# Patient Record
Sex: Female | Born: 1935
Health system: Southern US, Community
[De-identification: ages and names within clinical notes are randomized; demographics above are authoritative.]

## PROBLEM LIST (undated history)

## (undated) DIAGNOSIS — E079 Disorder of thyroid, unspecified: Secondary | ICD-10-CM

## (undated) HISTORY — DX: Disorder of thyroid, unspecified: E07.9

## (undated) HISTORY — PX: TUBAL LIGATION: SHX77

## (undated) HISTORY — PX: CATARACT EXTRACTION BILATERAL W/ ANTERIOR VITRECTOMY: SHX1304

## (undated) HISTORY — PX: APPENDECTOMY: SHX54

---

## 1999-03-24 ENCOUNTER — Other Ambulatory Visit: Admission: RE | Admit: 1999-03-24 | Discharge: 1999-03-24 | Payer: Self-pay | Admitting: Cardiology

## 2000-11-24 ENCOUNTER — Other Ambulatory Visit: Admission: RE | Admit: 2000-11-24 | Discharge: 2000-11-24 | Payer: Self-pay | Admitting: Internal Medicine

## 2001-03-11 ENCOUNTER — Encounter: Payer: Self-pay | Admitting: Internal Medicine

## 2001-03-11 ENCOUNTER — Encounter: Admission: RE | Admit: 2001-03-11 | Discharge: 2001-03-11 | Payer: Self-pay | Admitting: Internal Medicine

## 2001-11-29 ENCOUNTER — Other Ambulatory Visit: Admission: RE | Admit: 2001-11-29 | Discharge: 2001-11-29 | Payer: Self-pay | Admitting: Internal Medicine

## 2002-04-06 ENCOUNTER — Encounter: Admission: RE | Admit: 2002-04-06 | Discharge: 2002-04-06 | Payer: Self-pay | Admitting: Internal Medicine

## 2002-04-06 ENCOUNTER — Encounter: Payer: Self-pay | Admitting: Internal Medicine

## 2002-07-07 ENCOUNTER — Encounter: Admission: RE | Admit: 2002-07-07 | Discharge: 2002-07-07 | Payer: Self-pay | Admitting: Internal Medicine

## 2002-07-07 ENCOUNTER — Encounter: Payer: Self-pay | Admitting: Internal Medicine

## 2002-10-23 ENCOUNTER — Ambulatory Visit (HOSPITAL_COMMUNITY): Admission: RE | Admit: 2002-10-23 | Discharge: 2002-10-23 | Payer: Self-pay | Admitting: *Deleted

## 2002-10-23 ENCOUNTER — Encounter (INDEPENDENT_AMBULATORY_CARE_PROVIDER_SITE_OTHER): Payer: Self-pay

## 2003-02-05 ENCOUNTER — Other Ambulatory Visit: Admission: RE | Admit: 2003-02-05 | Discharge: 2003-02-05 | Payer: Self-pay | Admitting: Internal Medicine

## 2003-07-30 ENCOUNTER — Encounter: Admission: RE | Admit: 2003-07-30 | Discharge: 2003-07-30 | Payer: Self-pay | Admitting: Internal Medicine

## 2003-07-30 ENCOUNTER — Encounter: Payer: Self-pay | Admitting: Internal Medicine

## 2003-08-03 ENCOUNTER — Encounter: Admission: RE | Admit: 2003-08-03 | Discharge: 2003-08-03 | Payer: Self-pay | Admitting: Internal Medicine

## 2003-08-03 ENCOUNTER — Encounter: Payer: Self-pay | Admitting: Internal Medicine

## 2004-08-19 ENCOUNTER — Encounter: Admission: RE | Admit: 2004-08-19 | Discharge: 2004-08-19 | Payer: Self-pay | Admitting: Internal Medicine

## 2005-07-23 ENCOUNTER — Other Ambulatory Visit: Admission: RE | Admit: 2005-07-23 | Discharge: 2005-07-23 | Payer: Self-pay | Admitting: Internal Medicine

## 2005-08-31 ENCOUNTER — Encounter: Admission: RE | Admit: 2005-08-31 | Discharge: 2005-08-31 | Payer: Self-pay | Admitting: Internal Medicine

## 2006-09-02 ENCOUNTER — Encounter: Admission: RE | Admit: 2006-09-02 | Discharge: 2006-09-02 | Payer: Self-pay | Admitting: Internal Medicine

## 2007-09-07 ENCOUNTER — Encounter: Admission: RE | Admit: 2007-09-07 | Discharge: 2007-09-07 | Payer: Self-pay | Admitting: Obstetrics and Gynecology

## 2008-09-26 ENCOUNTER — Encounter: Admission: RE | Admit: 2008-09-26 | Discharge: 2008-09-26 | Payer: Self-pay | Admitting: Obstetrics and Gynecology

## 2009-08-05 ENCOUNTER — Encounter: Admission: RE | Admit: 2009-08-05 | Discharge: 2009-08-05 | Payer: Self-pay | Admitting: Internal Medicine

## 2009-10-02 ENCOUNTER — Encounter: Admission: RE | Admit: 2009-10-02 | Discharge: 2009-10-02 | Payer: Self-pay | Admitting: Obstetrics and Gynecology

## 2010-06-10 ENCOUNTER — Emergency Department (HOSPITAL_COMMUNITY): Admission: EM | Admit: 2010-06-10 | Discharge: 2010-06-10 | Payer: Self-pay | Admitting: Emergency Medicine

## 2011-04-03 NOTE — Op Note (Signed)
   NAME:  Evelyn Hurst, Evelyn Hurst                   ACCOUNT NO.:  [id number]   MEDICAL RECORD NO.:  [id number]                   PATIENT TYPE:  AMB   LOCATION:  ENDO                                 FACILITY:  [facility]   PHYSICIAN:  Georgiana Spinner, M.D.                 DATE OF BIRTH:  02-13-1935   DATE OF PROCEDURE:  DATE OF DISCHARGE:                                 OPERATIVE REPORT   PROCEDURE:  Upper endoscopy.   INDICATIONS:  GERD.   ANESTHESIA:  Demerol 50, Versed 6 mg.   DESCRIPTION OF PROCEDURE:  With the patient mildly sedated in the left  lateral decubitus position, the Olympus videoscopic endoscope was inserted  into the mouth, passed under direct vision through the esophagus which  appeared normal.  We entered into the stomach.  The fundus, body, antrum,  duodenal bulb, and second portion of the duodenum all appeared normal and  were photographed.  From this point, the endoscope was slowly withdrawn  taking circumferential views of the duodenal mucosa until the endoscope was  then pulled back into the stomach, placed in retroflexion to view the  stomach from below, and a slightly loose GE junction was seen and  photographed.  The endoscope was straightened and withdrawn taking  circumferential views of the remaining gastric and esophageal mucosa,  stopping in the distal esophagus to photograph the GE junction.  There was a  questionable area of Barrett's esophagus photographed and biopsied.  The  endoscope was withdrawn.  The patient's vital signs and pulse oximeter  remained stable.  The patient tolerated the procedure well with apparent  complications.   FINDINGS:  Questionable area of Barrett's esophagus, otherwise unremarkable  examination.  Await biopsy report.  The patient will call me for results and  follow up with me as an outpatient.  Proceed to colonoscopy as planned.                                               Georgiana Spinner, M.D.    GMO/MEDQ  D:  10/23/2002   T:  10/23/2002  Job:  [id number]   cc:   Darius Bump, M.D.  [address] [place]  [place] [address]  Fax: [phone]

## 2011-04-03 NOTE — Op Note (Signed)
   NAME:  Evelyn Hurst, Evelyn Hurst                   ACCOUNT NO.:  [id number]   MEDICAL RECORD NO.:  [id number]                   PATIENT TYPE:  AMB   LOCATION:  ENDO                                 FACILITY:  [facility]   PHYSICIAN:  Georgiana Spinner, M.D.                 DATE OF BIRTH:  1935/11/10   DATE OF PROCEDURE:  10/23/2002  DATE OF DISCHARGE:                                 OPERATIVE REPORT   PROCEDURE:  Colonoscopy.   INDICATIONS:  Colon cancer screening.   ANESTHESIA:  Versed 2 mg and Demerol 2 mg additionally.   DESCRIPTION OF PROCEDURE:  With the patient mildly sedated in the left  lateral decubitus position, the Olympus videoscopic colonoscope was inserted  in the rectum and passed easily under direct vision into the cecum,  identified by the ileocecal valve and appendiceal orifice, both of which  were photographed.  From this point the colonoscope was slowly withdrawn,  taking circumferential views of the entire colonic mucosa, stopping only in  the rectum, which appeared normal on direct and retroflex view.  The  endoscope was straightened and withdrawn.  The patient's vital signs and  pulse oximetry remained stable.  The patient tolerated the procedure well  without apparent complications.   FINDINGS:  This is a negative colonoscopic examination.   PLAN:  Repeat examination in possibly five or 10 years.                                               Georgiana Spinner, M.D.    GMO/MEDQ  D:  10/23/2002  T:  10/23/2002  Job:  [id number]   cc:   Darius Bump, M.D.  [place]  [place] [address]  Fax: [phone]

## 2012-05-04 DIAGNOSIS — L309 Dermatitis, unspecified: Secondary | ICD-10-CM | POA: Insufficient documentation

## 2012-05-10 DIAGNOSIS — L239 Allergic contact dermatitis, unspecified cause: Secondary | ICD-10-CM | POA: Insufficient documentation

## 2012-05-11 ENCOUNTER — Other Ambulatory Visit: Payer: Self-pay | Admitting: Obstetrics and Gynecology

## 2012-05-11 DIAGNOSIS — Z1231 Encounter for screening mammogram for malignant neoplasm of breast: Secondary | ICD-10-CM

## 2012-05-16 ENCOUNTER — Ambulatory Visit
Admission: RE | Admit: 2012-05-16 | Discharge: 2012-05-16 | Disposition: A | Discharge status: Home / Self Care | Payer: Medicare Other | Source: Ambulatory Visit | Attending: Obstetrics and Gynecology | Admitting: Obstetrics and Gynecology

## 2012-05-16 DIAGNOSIS — Z1231 Encounter for screening mammogram for malignant neoplasm of breast: Secondary | ICD-10-CM

## 2013-06-30 ENCOUNTER — Other Ambulatory Visit: Payer: Self-pay

## 2013-06-30 DIAGNOSIS — Z1231 Encounter for screening mammogram for malignant neoplasm of breast: Secondary | ICD-10-CM

## 2013-07-11 ENCOUNTER — Ambulatory Visit: Payer: Medicare Other

## 2013-07-27 ENCOUNTER — Ambulatory Visit: Payer: Medicare Other

## 2014-10-25 ENCOUNTER — Ambulatory Visit (INDEPENDENT_AMBULATORY_CARE_PROVIDER_SITE_OTHER): Payer: Medicare Other | Admitting: Podiatry

## 2014-10-25 ENCOUNTER — Encounter: Payer: Self-pay | Admitting: Podiatry

## 2014-10-25 VITALS — BP 136/80 | HR 76 | Resp 16

## 2014-10-25 DIAGNOSIS — L600 Ingrowing nail: Secondary | ICD-10-CM

## 2014-10-25 DIAGNOSIS — L03032 Cellulitis of left toe: Secondary | ICD-10-CM

## 2014-10-25 NOTE — Patient Instructions (Signed)

## 2014-10-27 NOTE — Progress Notes (Signed)
Subjective:     Patient ID: Evelyn Hurst, female   DOB: [birth year], 79 y.o.   MRN: [id number]  HPI patient presents stating I am developing a ingrown toenail my left big toe and also I think there may be infection which I'm not so sure about that makes it very tender and I noted drainage in the past   Review of Systems  All other systems reviewed and are negative.      Objective:   Physical Exam  Constitutional: She is oriented to person, place, and time.  Cardiovascular: Intact distal pulses.   Musculoskeletal: Normal range of motion.  Neurological: She is oriented to person, place, and time.  Skin: Skin is warm and dry.  Nursing note and vitals reviewed.  neurovascular status intact muscle strength adequate with range of motion within normal limits. Patient's left hallux lateral border is incurvated and I also noted distal redness that is more closer to the tip of the nailbed and is localized with no proximal edema erythema or drainage noted     Assessment:     Paronychia infection left hallux distal with chronic ingrown toenail deformity of the left hallux    Plan:     H&P and condition discussed and explained the different conditions that she has and reviewed. At this point I reviewed the ingrown component and that I like to fix it but also the fact there is distal infection that she will need to soak and that we will need to keep an eye on. She wants procedure and I explained risk of surgery for this and nonhealing which may occur. At this time I infiltrated 60 mg Xylocaine Marcaine mixture and remove the lateral corner and cleaned up distal necrotic tissue which was localized. The proximal nail bed is healthy and I apply chemical consisting of phenol 3 applications 30 seconds followed by alcohol lavaged and sterile dressing. Gave instructions on soaks

## 2015-07-30 ENCOUNTER — Encounter: Payer: Self-pay | Admitting: Internal Medicine

## 2015-09-19 ENCOUNTER — Encounter: Payer: Self-pay | Admitting: Internal Medicine

## 2015-09-19 ENCOUNTER — Ambulatory Visit (INDEPENDENT_AMBULATORY_CARE_PROVIDER_SITE_OTHER): Payer: Medicare Other | Admitting: Internal Medicine

## 2015-09-19 VITALS — BP 120/60 | HR 68 | Ht 63.5 in | Wt 152.5 lb

## 2015-09-19 DIAGNOSIS — R1032 Left lower quadrant pain: Secondary | ICD-10-CM

## 2015-09-19 NOTE — Patient Instructions (Signed)
Please follow up with Dr. Perry as needed 

## 2015-09-19 NOTE — Progress Notes (Signed)
HISTORY OF PRESENT ILLNESS:  Evelyn Hurst is a 80 y.o. female who is self-referred regarding possible diverticulitis. She is new to this practice. Patient reports that approximate 1 year ago she developed left lower quadrant pain. No other associated symptoms such as fever or bleeding. She did notice the discomfort to be somewhat worse with defecation. She was initially evaluated at her PCPs office who ruled out UTI. She was then referred to her gynecology office who ruled out GYN causes, but suspected diverticulitis for which she will return to see her PCP. The diagnosis of diverticulitis was given and she was placed on 2 antibiotics (presumably Cipro and Flagyl) which resulted in diarrhea. Her pain resolved without recurrence until this past Labor Day weekend when she developed similar but much milder discomfort. She was seen in her PCPs office who provided prescription for antibiotics if needed. However, the problem resolved without treatment. She's had no further problems. She reports that her bowel habits are unchanged and are generally 2-3 formed movements per day. No other issues or complaints. She states that she has had several colonoscopies in the past. Were were able to retrieve old records from the computer. On 10/23/2002 she underwent colonoscopy and upper endoscopy with Dr. Jim Desanctis. Colonoscopy was normal. Upper endoscopy was normal including esophageal biopsies. She states that she has not interested in follow-up colon cancer screening as she is more than 77. GI review of systems is otherwise negative  REVIEW OF SYSTEMS:  All non-GI ROS negative entirely upon comprehensive review  Past Medical History  Diagnosis Date  . Thyroid disease     Past Surgical History  Procedure Laterality Date  . Appendectomy    . Tubal ligation    . Cataract extraction bilateral w/ anterior vitrectomy      Social History Evelyn Hurst  reports that she has quit smoking. She does not have any  smokeless tobacco history on file. She reports that she drinks about 4.2 oz of alcohol per week. Her drug history is not on file.  family history includes Diabetes in her mother; Heart disease in her father.  Allergies  Allergen Reactions  . Other     Gold sodium thiosulfate Palladium chloride  . Silver Nitrate        PHYSICAL EXAMINATION: Vital signs: BP 120/60 mmHg  Pulse 68  Ht 5' 3.5" (1.613 m)  Wt 152 lb 8 oz (69.174 kg)  BMI 26.59 kg/m2  Constitutional: generally well-appearing, no acute distress Psychiatric: alert and oriented x3, cooperative Eyes: extraocular movements intact, anicteric, conjunctiva pink Mouth: oral pharynx moist, no lesions Neck: supple no lymphadenopathy Cardiovascular: heart regular rate and rhythm, no murmur Lungs: clear to auscultation bilaterally Abdomen: soft, nontender, nondistended, no obvious ascites, no peritoneal signs, normal bowel sounds, no organomegaly Rectal: Omitted Extremities: no clubbing cyanosis or lower extremity edema bilaterally Skin: no lesions on visible extremities Neuro: No focal deficits.   ASSESSMENT:  #1. 2 episodes of left lower quadrant discomfort as described. Possibly diverticulitis, though no diverticular disease on colonoscopy 13 years ago. May have developed in the interim. In any event currently asymptomatic without any alarm features   PLAN:  #1. Observation. Should she have further problems or new signs or symptoms, I have instructed her to be evaluated by her PCP or this office.

## 2017-02-25 ENCOUNTER — Encounter (INDEPENDENT_AMBULATORY_CARE_PROVIDER_SITE_OTHER): Payer: Self-pay | Admitting: Orthopedic Surgery

## 2017-02-25 ENCOUNTER — Ambulatory Visit (INDEPENDENT_AMBULATORY_CARE_PROVIDER_SITE_OTHER): Payer: Medicare Other

## 2017-02-25 ENCOUNTER — Ambulatory Visit (INDEPENDENT_AMBULATORY_CARE_PROVIDER_SITE_OTHER): Payer: Self-pay | Admitting: Orthopedic Surgery

## 2017-02-25 ENCOUNTER — Ambulatory Visit (INDEPENDENT_AMBULATORY_CARE_PROVIDER_SITE_OTHER): Payer: Medicare Other | Admitting: Orthopedic Surgery

## 2017-02-25 VITALS — Ht 63.0 in | Wt 152.0 lb

## 2017-02-25 DIAGNOSIS — M79671 Pain in right foot: Secondary | ICD-10-CM | POA: Diagnosis not present

## 2017-02-25 NOTE — Progress Notes (Signed)
Office Visit Note   Patient: Evelyn Hurst           Date of Birth: 30-Oct-1935           MRN: [id number] Visit Date: 02/25/2017              Requested by: [name], MD [address] [place], [place] [address] PCP: Irven Shelling, MD  Chief Complaint  Patient presents with  . Right Foot - Pain      HPI: Patient is 82 year old woman who states she's been having pain beneath the sesamoids right great toe as well as the second metatarsal heads. Patient's thought she had a stress fracture and then this morning she dropped a bottle of soap on her great toe. Patient complains of pain with ambulation she states she has a red lump on her great toe with some swelling.  Assessment & Plan: Visit Diagnoses:  1. Pain in right foot     Plan: Recommended a stiff soled walking shoe such as a new balance walking shoe recommended sole orthotics. Follow up if she is still symptomatic.  Follow-Up Instructions: Return if symptoms worsen or fail to improve.   Ortho Exam  Patient is alert, oriented, no adenopathy, well-dressed, normal affect, normal respiratory effort. Examination patient has a normal gait. She has a good dorsalis pedis pulse she has good ankle and subtalar motion she has dorsiflexion about 20 past neutral with her knee extended. She is tender to palpation beneath the second metatarsal head and tender to palpation beneath the sesamoids of the great toe. She does have hallux rigidus with dorsiflexion only about 30 with osteophytic bone spurs dorsally. She is asymptomatic MTP joint she has some swelling and redness over the IP joint from her recent trauma however radiographs shows no evidence of fracture.  Imaging: Xr Foot 2 Views Right  Result Date: 02/25/2017 2 view radiographs of the right foot shows no evidence of a fracture. 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 She has some degenerative arthritic changes of the great toe MTP joint.   Labs: No results  found for: HGBA1C, ESRSEDRATE, CRP, LABURIC, REPTSTATUS, GRAMSTAIN, CULT, LABORGA  Orders:  Orders Placed This Encounter  Procedures  . XR Foot 2 Views Right   No orders of the defined types were placed in this encounter.    Procedures: No procedures performed  Clinical Data: No additional findings.  ROS:  All other systems negative, except as noted in the HPI. Review of Systems  Objective: Vital Signs: Ht 5\' 3"  (1.6 m)   Wt 152 lb (68.9 kg)   BMI 26.93 kg/m   Specialty Comments:  No specialty comments available.  PMFS History: There are no active problems to display for this patient.  Past Medical History:  Diagnosis Date  . Thyroid disease     Family History  Problem Relation Age of Onset  . Diabetes Mother   . Heart disease Father     Past Surgical History:  Procedure Laterality Date  . APPENDECTOMY    . CATARACT EXTRACTION BILATERAL W/ ANTERIOR VITRECTOMY    . TUBAL LIGATION     Social History   Occupational History  . semi retired Engineer, maintenance (IT)    Social History Main Topics  . Smoking status: Former Research scientist (life sciences)  . Smokeless tobacco: Never Used  . Alcohol use 4.2 oz/week    7 Standard drinks or equivalent per week     Comment: wine  . Drug use: Unknown  . Sexual activity:  Not on file

## 2017-08-19 ENCOUNTER — Ambulatory Visit: Payer: Medicare Other | Admitting: Physical Therapy

## 2017-08-26 ENCOUNTER — Encounter: Payer: Self-pay | Admitting: Physical Therapy

## 2017-08-26 ENCOUNTER — Ambulatory Visit: Payer: Medicare Other | Attending: Internal Medicine | Admitting: Physical Therapy

## 2017-08-26 DIAGNOSIS — M6281 Muscle weakness (generalized): Secondary | ICD-10-CM | POA: Insufficient documentation

## 2017-08-26 NOTE — Therapy (Signed)
[place], [place], [address] Phone: [phone]   Fax:  [phone]  Physical Therapy Evaluation  Patient Details  Name: Evelyn Hurst MRN: [id number] Date of Birth: Mar 26, 1935 Referring Provider: [name], MD  Encounter Date: 08/26/2017      PT End of Session - 08/26/17 1024    Visit Number 1   Number of Visits 9   Date for PT Re-Evaluation 09/24/17   Authorization Type UHC MCR- KX at visit 15   PT Start Time 1017   PT Stop Time 1104   PT Time Calculation (min) 47 min   Activity Tolerance Patient tolerated treatment well   Behavior During Therapy [facility] for tasks assessed/performed      Past Medical History:  Diagnosis Date  . Thyroid disease     Past Surgical History:  Procedure Laterality Date  . APPENDECTOMY    . CATARACT EXTRACTION BILATERAL W/ ANTERIOR VITRECTOMY    . TUBAL LIGATION      There were no vitals filed for this visit.       Subjective Assessment - 08/26/17 1025    Subjective Had a bone density scan a year or two ago. does not she believe she has been diagnosed with osteoporosis. Does not want to take medications, would like to manage through diet & exercise. Denies pain to speak of, aches and pains occasionally. Tries to work out 4 days per week at sport time. Difficulty standing 5+ min.    Patient Stated Goals avoid requiring medications, learn appropriate exercises, increase standing tolerance   Currently in Pain? No/denies            [facility] PT Assessment - 08/26/17 0001      Assessment   Medical Diagnosis balance to prevent fractures, poor bone density   Referring Provider [name], MD   Onset Date/Surgical Date --  chronic   Prior Therapy not this year, prior PT for LBP     Precautions   Precautions Fall   Precaution Comments decreased bone density     Restrictions   Weight Bearing Restrictions No     Balance Screen   Has the patient fallen  in the past 6 months No     [place] residence   Additional Comments stairs at home     Prior Function   Vocation Part time employment   Environmental health practitioner     Cognition   Overall Cognitive Status Within Functional Limits for tasks assessed     Sensation   Additional Comments WFL     ROM / Strength   AROM / PROM / Strength Strength     Strength   Strength Assessment Site Hip;Shoulder   Right/Left Shoulder Right;Left   Right Shoulder ABduction 4-/5   Right Shoulder External Rotation 4-/5   Left Shoulder External Rotation 4-/5   Left Shoulder Horizontal ABduction --  4-/5   Right/Left Hip Right;Left   Right Hip Flexion 4-/5   Right Hip Extension 3/5   Right Hip ABduction 4/5   Left Hip Flexion 4/5   Left Hip Extension 3/5   Left Hip ABduction 5/5     High Level Balance   High Level Balance Comments req support for SLS, poor control in marching.             Objective measurements completed on examination: See above findings.          [facility] Adult PT Treatment/Exercise -  08/26/17 0001      Exercises   Exercises Lumbar     Lumbar Exercises: Supine   AB Set Limitations posterior pelvic tilt   Large Ball Abdominal Isometric 10 reps;5 seconds   Large Ball Oblique Isometric 10 reps;5 seconds   Other Supine Lumbar Exercises dead bug                PT Education - 08/26/17 1124    Education provided Yes   Education Details anatomy of condition, POC, HEP, exercise form/rationale   Person(s) Educated Patient   Methods Explanation;Demonstration;Tactile cues;Verbal cues;Handout   Comprehension Verbalized understanding;Returned demonstration;Verbal cues required;Tactile cues required;Need further instruction             PT Long Term Goals - 08/26/17 1117      PT LONG TERM GOAL #1   Title Pt will report ability to stand for at least 20 min without limitation by fatigue/discomfort   Baseline  5 min at  eval   Time 4   Period Weeks   Status New   Target Date 09/24/17     PT LONG TERM GOAL #2   Title Pt will demo gross 4+/5 hip and shoulder strength bilaterally for necessary support to biomechanical chain   Baseline see flowsheet   Time 4   Period Weeks   Status New   Target Date 09/24/17     PT LONG TERM GOAL #3   Title pt will demo ability to maintain SLS for 5s without UE suppor to indicate improved balance control   Baseline unable without UE support   Time 4   Period Weeks   Status New   Target Date 09/24/17     PT LONG TERM GOAL #4   Title Pt will verbalize ability to utilize new exercises and proper form in her regular workouts   Baseline began educating at eval   Time 4   Period Weeks   Status New   Target Date 09/24/17                Plan - 08/26/17 1111    Clinical Impression Statement Pt presents to PT with referral for balance and exercises to reduce risk of fall and fracture as a result of decreased bone density. Pt is very active and tries to eat healthy. We discussed her current exercise program and changes to make to increase benefits to skeletal system. Pt will benefit from skilled PT in order to edcuate on proper exercise and train balance to decrease fall risk.    History and Personal Factors relevant to plan of care: thyroid disease   Clinical Presentation Stable   Clinical Decision Making Low   Rehab Potential Good   PT Frequency 2x / week   PT Duration 4 weeks   PT Treatment/Interventions ADLs/Self Care Home Management;Cryotherapy;Functional mobility training;Stair training;Gait training;Moist Heat;Therapeutic activities;Therapeutic exercise;Balance training;Neuromuscular re-education;Patient/family education;Passive range of motion;Manual techniques   PT Next Visit Plan try eliptical rather than treadmill, balance challenges, squat form, planks   PT Home Exercise Plan abdominal engagement, iso core press physioball, dead bug   Consulted and  Agree with Plan of Care Patient      Patient will benefit from skilled therapeutic intervention in order to improve the following deficits and impairments:  Decreased strength, Decreased activity tolerance, Decreased balance  Visit Diagnosis: Muscle weakness (generalized) - Plan: PT plan of care cert/re-cert      G-Codes - 78/29/56 1125    Functional Assessment Tool Used (Outpatient Only)  strength & balance, clinical judgement   Functional Limitation Mobility: Walking and moving around   Mobility: Walking and Moving Around Current Status [phone]) At least 20 percent but less than 40 percent impaired, limited or restricted   Mobility: Walking and Moving Around Goal Status [phone]) At least 1 percent but less than 20 percent impaired, limited or restricted       Problem List There are no active problems to display for this patient.  Len Azeez C. Decklyn Hornik PT, DPT 08/26/17 11:26 AM   [place] [facility] [address] [place], [place], [address] Phone: [phone]   Fax:  [phone]  Name: Evelyn Hurst MRN: [id number] Date of Birth: 28-Dec-1934

## 2017-09-07 ENCOUNTER — Encounter: Payer: Self-pay | Admitting: Physical Therapy

## 2017-09-07 ENCOUNTER — Ambulatory Visit: Payer: Medicare Other | Admitting: Physical Therapy

## 2017-09-07 DIAGNOSIS — M6281 Muscle weakness (generalized): Secondary | ICD-10-CM

## 2017-09-07 NOTE — Therapy (Signed)
[place], [place], [address] Phone: [phone]   Fax:  [phone]  Physical Therapy Treatment  Patient Details  Name: Evelyn Hurst MRN: [id number] Date of Birth: [birth year] Referring Provider: [name], MD  Encounter Date: 09/07/2017      PT End of Session - 09/07/17 0929    Visit Number 2   Number of Visits 9   Date for PT Re-Evaluation 09/24/17   Authorization Type UHC MCR- KX at visit 15   PT Start Time 0925   PT Stop Time 1018   PT Time Calculation (min) 53 min   Activity Tolerance Patient tolerated treatment well   Behavior During Therapy [facility] for tasks assessed/performed      Past Medical History:  Diagnosis Date  . Thyroid disease     Past Surgical History:  Procedure Laterality Date  . APPENDECTOMY    . CATARACT EXTRACTION BILATERAL W/ ANTERIOR VITRECTOMY    . TUBAL LIGATION      There were no vitals filed for this visit.      Subjective Assessment - 09/07/17 0930    Subjective Has been trying to practice abdominal engagement. Short bursts on eliptical due to back pain.    Patient Stated Goals avoid requiring medications, learn appropriate exercises, increase standing tolerance   Currently in Pain? No/denies                         [facility] Adult PT Treatment/Exercise - 09/07/17 0001      Lumbar Exercises: Stretches   Passive Hamstring Stretch 2 reps;30 seconds   Passive Hamstring Stretch Limitations seated EOB   Piriformis Stretch Limitations press & pull in figure 4     Lumbar Exercises: Aerobic   Elliptical 5 min L1 ramp 9     Lumbar Exercises: Machines for Strengthening   Leg Press supine leg press 20lb     Lumbar Exercises: Seated   Sit to Stand 15 reps   Sit to Stand Limitations ball bw knees     Lumbar Exercises: Supine   Bridge 20 reps   Bridge Limitations ball bw knees   Straight Leg Raise 10 reps  both   Straight Leg Raises  Limitations x10 neutra & x10 ext rot                PT Education - 09/07/17 1014    Education provided Yes   Education Details exercise form/rationale, midfulness, "what am I feeling v am I doing it right"   Person(s) Educated Patient   Methods Explanation;Demonstration;Tactile cues;Verbal cues;Handout   Comprehension Verbalized understanding;Returned demonstration;Verbal cues required;Tactile cues required;Need further instruction             PT Long Term Goals - 08/26/17 1117      PT LONG TERM GOAL #1   Title Pt will report ability to stand for at least 20 min without limitation by fatigue/discomfort   Baseline  5 min at eval   Time 4   Period Weeks   Status New   Target Date 09/24/17     PT LONG TERM GOAL #2   Title Pt will demo gross 4+/5 hip and shoulder strength bilaterally for necessary support to biomechanical chain   Baseline see flowsheet   Time 4   Period Weeks   Status New   Target Date 09/24/17     PT LONG TERM GOAL #3   Title pt will demo ability to  maintain SLS for 5s without UE suppor to indicate improved balance control   Baseline unable without UE support   Time 4   Period Weeks   Status New   Target Date 09/24/17     PT LONG TERM GOAL #4   Title Pt will verbalize ability to utilize new exercises and proper form in her regular workouts   Baseline began educating at eval   Time 4   Period Weeks   Status New   Target Date 09/24/17               Plan - 09/07/17 1001    Clinical Impression Statement Cuing for exercies form/appropriate muscular engagement. Pt with difficulty during repetitions but was able to maintain form. Will continue to educate on proper exercises and form for safe workouts & avoid falls/fractures.    PT Treatment/Interventions ADLs/Self Care Home Management;Cryotherapy;Functional mobility training;Stair training;Gait training;Moist Heat;Therapeutic activities;Therapeutic exercise;Balance training;Neuromuscular  re-education;Patient/family education;Passive range of motion;Manual techniques   PT Next Visit Plan  review squats, standing balance, planks   PT Home Exercise Plan abdominal engagement, iso core press physioball, dead bug, SLR neutral/ER, stretches: figure 4, HSS, thomas;   Consulted and Agree with Plan of Care Patient      Patient will benefit from skilled therapeutic intervention in order to improve the following deficits and impairments:  Decreased strength, Decreased activity tolerance, Decreased balance  Visit Diagnosis: Muscle weakness (generalized)     Problem List There are no active problems to display for this patient.   Lucien Budney C. Jakyrie Totherow PT, DPT 09/07/17 10:55 AM   [place] [facility] [address] [place], [place], [address] Phone: [phone]   Fax:  [phone]  Name: Evelyn Hurst MRN: [id number] Date of Birth: 1935/02/15

## 2017-09-14 ENCOUNTER — Ambulatory Visit: Payer: Medicare Other | Admitting: Physical Therapy

## 2017-09-14 ENCOUNTER — Encounter: Payer: Self-pay | Admitting: Physical Therapy

## 2017-09-14 DIAGNOSIS — M6281 Muscle weakness (generalized): Secondary | ICD-10-CM | POA: Diagnosis not present

## 2017-09-14 NOTE — Therapy (Signed)
[place], [place], [address] Phone: [phone]   Fax:  [phone]  Physical Therapy Treatment  Patient Details  Name: Evelyn Hurst MRN: [id number] Date of Birth: [birth year] Referring Provider: [name], MD  Encounter Date: 09/14/2017      PT End of Session - 09/14/17 1057    Visit Number 3   Number of Visits 9   Date for PT Re-Evaluation 09/24/17   Authorization Type UHC MCR- KX at visit 15   PT Start Time 1058   PT Stop Time 1144   PT Time Calculation (min) 46 min   Activity Tolerance Patient tolerated treatment well   Behavior During Therapy [facility] for tasks assessed/performed      Past Medical History:  Diagnosis Date  . Thyroid disease     Past Surgical History:  Procedure Laterality Date  . APPENDECTOMY    . CATARACT EXTRACTION BILATERAL W/ ANTERIOR VITRECTOMY    . TUBAL LIGATION      There were no vitals filed for this visit.      Subjective Assessment - 09/14/17 1058    Subjective believes that the bridges may have irritated a disk in her back, was able to take a shower to reduce the pain.    Patient Stated Goals avoid requiring medications, learn appropriate exercises, increase standing tolerance   Currently in Pain? No/denies                         [facility] Adult PT Treatment/Exercise - 09/14/17 0001      Lumbar Exercises: Stretches   Active Hamstring Stretch 5 reps  both   Single Knee to Chest Stretch Limitations thomas test stretch   Piriformis Stretch Limitations press & pull in figure 4     Lumbar Exercises: Aerobic   Stationary Bike nu step 5 min L5 UE & LE     Lumbar Exercises: Standing   Other Standing Lumbar Exercises SLS- still, head turns, UE PNFs   Other Standing Lumbar Exercises tandem UE PNFs; on airex: NBOS head turns, marching     Lumbar Exercises: Supine   Bridge 10 reps   Bridge Limitations ball bw knees                PT Education - 09/14/17 1239    Education provided Yes   Education Details exercise form/rationale   Person(s) Educated Patient   Methods Explanation;Demonstration;Tactile cues;Verbal cues;Handout   Comprehension Verbalized understanding;Returned demonstration;Verbal cues required;Tactile cues required;Need further instruction             PT Long Term Goals - 08/26/17 1117      PT LONG TERM GOAL #1   Title Pt will report ability to stand for at least 20 min without limitation by fatigue/discomfort   Baseline  5 min at eval   Time 4   Period Weeks   Status New   Target Date 09/24/17     PT LONG TERM GOAL #2   Title Pt will demo gross 4+/5 hip and shoulder strength bilaterally for necessary support to biomechanical chain   Baseline see flowsheet   Time 4   Period Weeks   Status New   Target Date 09/24/17     PT LONG TERM GOAL #3   Title pt will demo ability to maintain SLS for 5s without UE suppor to indicate improved balance control   Baseline unable without UE support   Time 4  Period Weeks   Status New   Target Date 09/24/17     PT LONG TERM GOAL #4   Title Pt will verbalize ability to utilize new exercises and proper form in her regular workouts   Baseline began educating at eval   Time 4   Period Weeks   Status New   Target Date 09/24/17               Plan - 09/14/17 1139    Clinical Impression Statement Focus today was on balance challenges that the pt can safely perform in the gym. Challenged by head turns in balance holds as well as control in dynamic activities.    PT Treatment/Interventions ADLs/Self Care Home Management;Cryotherapy;Functional mobility training;Stair training;Gait training;Moist Heat;Therapeutic activities;Therapeutic exercise;Balance training;Neuromuscular re-education;Patient/family education;Passive range of motion;Manual techniques   PT Next Visit Plan planks, UE   PT Home Exercise Plan abdominal engagement, iso core press  physioball, dead bug, SLR neutral/ER, stretches: figure 4, HSS, thomas;   Consulted and Agree with Plan of Care Patient      Patient will benefit from skilled therapeutic intervention in order to improve the following deficits and impairments:  Decreased strength, Decreased activity tolerance, Decreased balance  Visit Diagnosis: Muscle weakness (generalized)     Problem List There are no active problems to display for this patient.   Shardae Kleinman C. Clifton Safley PT, DPT 09/14/17 12:41 PM   [place] [facility] [address] [place], [place], [address] Phone: [phone]   Fax:  [phone]  Name: Evelyn Hurst MRN: [id number] Date of Birth: 05/31/1935

## 2017-09-16 ENCOUNTER — Encounter: Payer: Self-pay | Admitting: Physical Therapy

## 2017-09-16 ENCOUNTER — Ambulatory Visit: Payer: Medicare Other | Attending: Internal Medicine | Admitting: Physical Therapy

## 2017-09-16 DIAGNOSIS — M6281 Muscle weakness (generalized): Secondary | ICD-10-CM | POA: Insufficient documentation

## 2017-09-16 NOTE — Therapy (Signed)
[place], [place], [address] Phone: [phone]   Fax:  [phone]  Physical Therapy Treatment  Patient Details  Name: Evelyn Hurst MRN: [id number] Date of Birth: [birth year] Referring Provider: [name], MD  Encounter Date: 09/16/2017      PT End of Session - 09/16/17 1141    Visit Number 4   Number of Visits 9   Date for PT Re-Evaluation 09/24/17   Authorization Type UHC MCR- KX at visit 15   PT Start Time 1141   PT Stop Time 1232   PT Time Calculation (min) 51 min   Activity Tolerance Patient tolerated treatment well   Behavior During Therapy [facility] for tasks assessed/performed      Past Medical History:  Diagnosis Date  . Thyroid disease     Past Surgical History:  Procedure Laterality Date  . APPENDECTOMY    . CATARACT EXTRACTION BILATERAL W/ ANTERIOR VITRECTOMY    . TUBAL LIGATION      There were no vitals filed for this visit.      Subjective Assessment - 09/16/17 1142    Subjective wants to review some of the exercises from last time   Patient Stated Goals avoid requiring medications, learn appropriate exercises, increase standing tolerance   Currently in Pain? No/denies                         [facility] Adult PT Treatment/Exercise - 09/16/17 0001      Lumbar Exercises: Aerobic   Elliptical 5 min L1 ramp 10                PT Education - 09/16/17 1233    Education provided Yes   Education Details exercise form/rationale, HEP, importance of posture/engaging periscapular region, balancing workouts/having a plan   Person(s) Educated Patient   Methods Explanation;Demonstration;Tactile cues;Verbal cues;Handout   Comprehension Verbalized understanding;Returned demonstration;Verbal cues required;Tactile cues required;Need further instruction             PT Long Term Goals - 08/26/17 1117      PT LONG TERM GOAL #1   Title Pt will report ability  to stand for at least 20 min without limitation by fatigue/discomfort   Baseline  5 min at eval   Time 4   Period Weeks   Status New   Target Date 09/24/17     PT LONG TERM GOAL #2   Title Pt will demo gross 4+/5 hip and shoulder strength bilaterally for necessary support to biomechanical chain   Baseline see flowsheet   Time 4   Period Weeks   Status New   Target Date 09/24/17     PT LONG TERM GOAL #3   Title pt will demo ability to maintain SLS for 5s without UE suppor to indicate improved balance control   Baseline unable without UE support   Time 4   Period Weeks   Status New   Target Date 09/24/17     PT LONG TERM GOAL #4   Title Pt will verbalize ability to utilize new exercises and proper form in her regular workouts   Baseline began educating at eval   Time 4   Period Weeks   Status New   Target Date 09/24/17               Plan - 09/16/17 1233    Clinical Impression Statement Worked on upper body exercises today and introduced row &  lat pull down machine. Pt requires heavy cuing but was able to demo with good form. Will incorporate into her regular workouts.    PT Treatment/Interventions ADLs/Self Care Home Management;Cryotherapy;Functional mobility training;Stair training;Gait training;Moist Heat;Therapeutic activities;Therapeutic exercise;Balance training;Neuromuscular re-education;Patient/family education;Passive range of motion;Manual techniques   PT Next Visit Plan planks   PT Home Exercise Plan abdominal engagement, iso core press physioball, dead bug, SLR neutral/ER, stretches: figure 4, HSS, thomas;   Consulted and Agree with Plan of Care Patient      Patient will benefit from skilled therapeutic intervention in order to improve the following deficits and impairments:  Decreased strength, Decreased activity tolerance, Decreased balance  Visit Diagnosis: Muscle weakness (generalized)     Problem List There are no active problems to display for  this patient.  Craigory Toste C. Eloy Fehl PT, DPT 09/16/17 12:35 PM   [facility] [address] [place], [place], [address] Phone: [phone]   Fax:  [phone]  Name: Evelyn Hurst MRN: [id number] Date of Birth: [birth year]

## 2017-09-21 ENCOUNTER — Ambulatory Visit: Payer: Medicare Other | Admitting: Physical Therapy

## 2017-09-21 ENCOUNTER — Encounter: Payer: Self-pay | Admitting: Physical Therapy

## 2017-09-21 DIAGNOSIS — M6281 Muscle weakness (generalized): Secondary | ICD-10-CM

## 2017-09-21 NOTE — Therapy (Signed)
[place], [place], [address] Phone: [phone]   Fax:  [phone]  Physical Therapy Treatment  Patient Details  Name: Evelyn Hurst MRN: [id number] Date of Birth: 05/12/1935 Referring Provider: [name], MD   Encounter Date: 09/21/2017  PT End of Session - 09/21/17 1507    Visit Number  5    Number of Visits  9    Date for PT Re-Evaluation  09/24/17    Authorization Type  UHC MCR- KX at visit 15    PT Start Time  0215    PT Stop Time  0255    PT Time Calculation (min)  40 min       Past Medical History:  Diagnosis Date  . Thyroid disease     Past Surgical History:  Procedure Laterality Date  . APPENDECTOMY    . CATARACT EXTRACTION BILATERAL W/ ANTERIOR VITRECTOMY    . TUBAL LIGATION      There were no vitals filed for this visit.  Subjective Assessment - 09/21/17 1419    Subjective  Doing well today and after last visit.     Currently in Pain?  No/denies                      [facility] Adult PT Treatment/Exercise - 09/21/17 0001      Lumbar Exercises: Stretches   Active Hamstring Stretch  5 reps both   both   Piriformis Stretch Limitations  press & pull in figure 4      Lumbar Exercises: Aerobic   Elliptical  5 min L1 ramp 1      Lumbar Exercises: Machines for Strengthening   Other Lumbar Machine Exercise  Low 20# x 20 , mid row 10# x 20 , Lat pull down 10# x 20       Lumbar Exercises: Standing   Other Standing Lumbar Exercises  grapevine, sidestepping, tandem forward and retro gait, static tandem on foam, static SLS on foam, step on and off foam holding SLS 5 sec x 5 each, then lateral step on foam holding SLS 5 seconds each       Lumbar Exercises: Supine   Bridge  10 reps small ROM   small ROM   Bridge Limitations  ball bw knees    Straight Leg Raises Limitations  x10 neutra & x10 ext rot    Other Supine Lumbar Exercises  dead bug                   PT Long Term Goals - 08/26/17 1117      PT LONG TERM GOAL #1   Title  Pt will report ability to stand for at least 20 min without limitation by fatigue/discomfort    Baseline   5 min at eval    Time  4    Period  Weeks    Status  New    Target Date  09/24/17      PT LONG TERM GOAL #2   Title  Pt will demo gross 4+/5 hip and shoulder strength bilaterally for necessary support to biomechanical chain    Baseline  see flowsheet    Time  4    Period  Weeks    Status  New    Target Date  09/24/17      PT LONG TERM GOAL #3   Title  pt will demo ability to maintain SLS for 5s without UE suppor to indicate  improved balance control    Baseline  unable without UE support    Time  4    Period  Weeks    Status  New    Target Date  09/24/17      PT LONG TERM GOAL #4   Title  Pt will verbalize ability to utilize new exercises and proper form in her regular workouts    Baseline  began educating at eval    Time  4    Period  Weeks    Status  New    Target Date  09/24/17            Plan - 09/21/17 1457    Clinical Impression Statement  Pt reports doing well with exercise and balance HEP, She did 30 minutes of aerobic machines at the gym today. We tried Prone plank from elbows and knees with pt c/o neck and left clavicle pain so discontinued.     PT Next Visit Plan  try plank again? wall planks     PT Home Exercise Plan  abdominal engagement, iso core press physioball, dead bug, SLR neutral/ER, stretches: figure 4, HSS, thomas;    Consulted and Agree with Plan of Care  Patient       Patient will benefit from skilled therapeutic intervention in order to improve the following deficits and impairments:  Decreased strength, Decreased activity tolerance, Decreased balance  Visit Diagnosis: Muscle weakness (generalized)     Problem List There are no active problems to display for this patient.   Dorene Ar, [place] 09/21/2017, 3:10 PM  [place], [place], [address] Phone: [phone]   Fax:  [phone]  Name: ALEEAH GREENO MRN: [id number] Date of Birth: 1935-10-13

## 2017-09-23 ENCOUNTER — Ambulatory Visit: Payer: Medicare Other | Admitting: Physical Therapy

## 2017-09-23 ENCOUNTER — Encounter: Payer: Self-pay | Admitting: Physical Therapy

## 2017-09-23 DIAGNOSIS — M6281 Muscle weakness (generalized): Secondary | ICD-10-CM | POA: Diagnosis not present

## 2017-09-23 NOTE — Therapy (Signed)
[place], [place], [address] Phone: [phone]   Fax:  [phone]  Physical Therapy Treatment  Patient Details  Name: Evelyn Hurst MRN: [id number] Date of Birth: Jul 22, 1935 Referring Provider: [name], MD   Encounter Date: 09/23/2017  PT End of Session - 09/23/17 1149    Visit Number  6    Number of Visits  9    Date for PT Re-Evaluation  09/24/17    Authorization Type  U[employer] MCR- KX at visit 15    PT Start Time  1145    PT Stop Time  1230    PT Time Calculation (min)  45 min       Past Medical History:  Diagnosis Date  . Thyroid disease     Past Surgical History:  Procedure Laterality Date  . APPENDECTOMY    . CATARACT EXTRACTION BILATERAL W/ ANTERIOR VITRECTOMY    . TUBAL LIGATION      There were no vitals filed for this visit.  Subjective Assessment - 09/23/17 1148    Subjective  Ankles were a little sore from the balance exercises.     Currently in Pain?  No/denies                      [facility] Adult PT Treatment/Exercise - 09/23/17 0001      Lumbar Exercises: Stretches   Active Hamstring Stretch  -- both    Passive Hamstring Stretch  2 reps;30 seconds    Single Knee to Chest Stretch Limitations  thomas test stretch    Piriformis Stretch Limitations  press & pull in figure 4      Lumbar Exercises: Aerobic   Elliptical  5 min L1 ramp 5      Lumbar Exercises: Machines for Strengthening   Other Lumbar Machine Exercise  Low 20# x 20 , mid row 10# x 20 , Lat pull down 10# x 20       Lumbar Exercises: Standing   Other Standing Lumbar Exercises  SLS 28 sec right , 12 sec left     Other Standing Lumbar Exercises  grapevine, sidestepping, tandem forward and retro gait, static tandem, static SLS 28 sec best right , 12 sec best L today      Lumbar Exercises: Seated   Sit to Stand  10 reps      Lumbar Exercises: Supine   Clam  10 reps green, discomfort right hip     Bridge  10 reps small ROM    Bridge Limitations  ball bw knees    Straight Leg Raises Limitations  x10 neutra & x10 ext rot    Other Supine Lumbar Exercises  dead bug                  PT Long Term Goals - 08/26/17 1117      PT LONG TERM GOAL #1   Title  Pt will report ability to stand for at least 20 min without limitation by fatigue/discomfort    Baseline   5 min at eval    Time  4    Period  Weeks    Status  New    Target Date  09/24/17      PT LONG TERM GOAL #2   Title  Pt will demo gross 4+/5 hip and shoulder strength bilaterally for necessary support to biomechanical chain    Baseline  see flowsheet    Time  4  Period  Weeks    Status  New    Target Date  09/24/17      PT LONG TERM GOAL #3   Title  pt will demo ability to maintain SLS for 5s without UE suppor to indicate improved balance control    Baseline  unable without UE support    Time  4    Period  Weeks    Status  New    Target Date  09/24/17      PT LONG TERM GOAL #4   Title  Pt will verbalize ability to utilize new exercises and proper form in her regular workouts    Baseline  began educating at eval    Time  4    Period  Weeks    Status  New    Target Date  09/24/17            Plan - 09/23/17 1214    Clinical Impression Statement  Pt reports ankle soreness after airex exercises last  visit. Continued balance challenges on level surface. SLS time improved. Pt reports no change in daily pain however she does not try to stand very long so she will not experience pain.     PT Next Visit Plan  try plank again? wall planks     PT Home Exercise Plan  abdominal engagement, iso core press physioball, dead bug, SLR neutral/ER, stretches: figure 4, HSS, thomas;    Consulted and Agree with Plan of Care  Patient       Patient will benefit from skilled therapeutic intervention in order to improve the following deficits and impairments:  Decreased strength, Decreased activity tolerance,  Decreased balance  Visit Diagnosis: Muscle weakness (generalized)     Problem List There are no active problems to display for this patient.   Dorene Ar, [place] 09/23/2017, 1:39 PM  [place], [place], [address] Phone: [phone]   Fax:  [phone]  Name: Evelyn Hurst MRN: [id number] Date of Birth: 01-Nov-1935

## 2017-09-28 ENCOUNTER — Encounter: Payer: Medicare Other | Admitting: Physical Therapy

## 2017-09-30 ENCOUNTER — Ambulatory Visit: Payer: Medicare Other | Admitting: Physical Therapy

## 2017-09-30 ENCOUNTER — Encounter: Payer: Self-pay | Admitting: Physical Therapy

## 2017-09-30 ENCOUNTER — Encounter: Payer: Medicare Other | Admitting: Physical Therapy

## 2017-09-30 DIAGNOSIS — M6281 Muscle weakness (generalized): Secondary | ICD-10-CM | POA: Diagnosis not present

## 2017-09-30 NOTE — Therapy (Signed)
[place], [place], [address] Phone: [phone]   Fax:  [phone]  Physical Therapy Treatment  Patient Details  Name: Evelyn Hurst MRN: [id number] Date of Birth: [birth year] Referring Provider: [name], MD   Encounter Date: 09/30/2017  PT End of Session - 09/30/17 0934    Visit Number  7    Number of Visits  9    Date for PT Re-Evaluation  09/24/17    Authorization Type  U[employer] MCR- KX at visit 15    PT Start Time  0932    PT Stop Time  1013    PT Time Calculation (min)  41 min    Activity Tolerance  Patient tolerated treatment well    Behavior During Therapy  [facility] for tasks assessed/performed       Past Medical History:  Diagnosis Date  . Thyroid disease     Past Surgical History:  Procedure Laterality Date  . APPENDECTOMY    . CATARACT EXTRACTION BILATERAL W/ ANTERIOR VITRECTOMY    . TUBAL LIGATION      There were no vitals filed for this visit.  Subjective Assessment - 09/30/17 0935    Subjective  Has been trying to incorporate exercises into her routine. Ordered an airex.     Patient Stated Goals  avoid requiring medications, learn appropriate exercises, increase standing tolerance    Currently in Pain?  No/denies                      [facility] Adult PT Treatment/Exercise - 09/30/17 0001      Lumbar Exercises: Aerobic   Elliptical  5 min L1 ramp 5      Lumbar Exercises: Seated   Other Seated Lumbar Exercises  seated exercises on physioball      Lumbar Exercises: Supine   Bridge  10 reps    Bridge Limitations  with clam red tband      Lumbar Exercises: Sidelying   Clam  10 reps red tband             PT Education - 09/30/17 1011    Education provided  Yes    Education Details  exercise form/rationale, HEP    Person(s) Educated  Patient    Methods  Demonstration;Tactile cues;Explanation;Verbal cues;Handout    Comprehension  Verbalized  understanding;Need further instruction;Returned demonstration;Verbal cues required;Tactile cues required          PT Long Term Goals - 08/26/17 1117      PT LONG TERM GOAL #1   Title  Pt will report ability to stand for at least 20 min without limitation by fatigue/discomfort    Baseline   5 min at eval    Time  4    Period  Weeks    Status  New    Target Date  09/24/17      PT LONG TERM GOAL #2   Title  Pt will demo gross 4+/5 hip and shoulder strength bilaterally for necessary support to biomechanical chain    Baseline  see flowsheet    Time  4    Period  Weeks    Status  New    Target Date  09/24/17      PT LONG TERM GOAL #3   Title  pt will demo ability to maintain SLS for 5s without UE suppor to indicate improved balance control    Baseline  unable without UE support    Time  4    Period  Weeks    Status  New    Target Date  09/24/17      PT LONG TERM GOAL #4   Title  Pt will verbalize ability to utilize new exercises and proper form in her regular workouts    Baseline  began educating at eval    Time  4    Period  Weeks    Status  New    Target Date  09/24/17            Plan - 09/30/17 1013    Clinical Impression Statement  Combo balance and strength today using physioball which pt enjoyed. Fearful of bridge height for fear of back pain.     PT Treatment/Interventions  ADLs/Self Care Home Management;Cryotherapy;Functional mobility training;Stair training;Gait training;Moist Heat;Therapeutic activities;Therapeutic exercise;Balance training;Neuromuscular re-education;Patient/family education;Passive range of motion;Manual techniques    PT Next Visit Plan  consider trying planks again, D/C    PT Home Exercise Plan  abdominal engagement, iso core press physioball, dead bug, SLR neutral/ER, stretches: figure 4, HSS, thomas;seated physioball exercises       Patient will benefit from skilled therapeutic intervention in order to improve the following deficits and  impairments:  Decreased strength, Decreased activity tolerance, Decreased balance  Visit Diagnosis: Muscle weakness (generalized)     Problem List There are no active problems to display for this patient.   Jevin Camino C. Reynalda Canny PT, DPT 09/30/17 10:15 AM   [place] [facility] [address] [place], [place], [address] Phone: [phone]   Fax:  [phone]  Name: ILIANA Hurst MRN: [id number] Date of Birth: 08-04-1935

## 2017-10-05 ENCOUNTER — Ambulatory Visit: Payer: Medicare Other | Admitting: Physical Therapy

## 2017-10-05 ENCOUNTER — Encounter: Payer: Medicare Other | Admitting: Physical Therapy

## 2017-10-05 ENCOUNTER — Encounter: Payer: Self-pay | Admitting: Physical Therapy

## 2017-10-05 DIAGNOSIS — M6281 Muscle weakness (generalized): Secondary | ICD-10-CM | POA: Diagnosis not present

## 2017-10-05 NOTE — Therapy (Signed)
[place], [place], [address] Phone: [phone]   Fax:  [phone]  Physical Therapy Treatment/Discharge Summary  Patient Details  Name: Evelyn Hurst MRN: [id number] Date of Birth: 11/03/1935 Referring Provider: [name], MD   Encounter Date: 10/05/2017  PT End of Session - 10/05/17 1503    Visit Number  8    Number of Visits  9    Date for PT Re-Evaluation  10/05/17    Authorization Type  [employer] MCR- KX at visit 15    PT Start Time  1501    PT Stop Time  1544    PT Time Calculation (min)  43 min    Activity Tolerance  Patient tolerated treatment well    Behavior During Therapy  [facility] for tasks assessed/performed       Past Medical History:  Diagnosis Date  . Thyroid disease     Past Surgical History:  Procedure Laterality Date  . APPENDECTOMY    . CATARACT EXTRACTION BILATERAL W/ ANTERIOR VITRECTOMY    . TUBAL LIGATION      There were no vitals filed for this visit.  Subjective Assessment - 10/05/17 1554    Subjective  Has been trying to do exercises with her regular routine, feels comfortable and confident.     Patient Stated Goals  avoid requiring medications, learn appropriate exercises, increase standing tolerance    Currently in Pain?  No/denies         [facility] PT Assessment - 10/05/17 0001      Strength   Right Shoulder ABduction  5/5    Right Shoulder External Rotation  5/5    Left Shoulder External Rotation  5/5    Right Hip Flexion  5/5    Right Hip Extension  4/5    Right Hip ABduction  5/5    Left Hip Flexion  4+/5    Left Hip Extension  5/5    Left Hip ABduction  5/5                  [facility] Adult PT Treatment/Exercise - 10/05/17 0001      Lumbar Exercises: Aerobic   Elliptical  5 min L1 ramp 5      Lumbar Exercises: Machines for Strengthening   Leg Press  horizontal leg press 1 plate      Lumbar Exercises: Standing   Other Standing Lumbar  Exercises  planks- counter, low mat             PT Education - 10/05/17 1555    Education provided  Yes    Education Details  [facility] vs [facility] & effects, static and dynamic balance, posture/exercises at work, goals discussion.     Person(s) Educated  Patient    Methods  Explanation;Demonstration;Tactile cues;Verbal cues    Comprehension  Verbalized understanding;Returned demonstration;Verbal cues required;Tactile cues required          PT Long Term Goals - 10/05/17 1514      PT LONG TERM GOAL #1   Title  Pt will report ability to stand for at least 20 min without limitation by fatigue/discomfort    Baseline  was able to stand 30 min in line today, not quite all comfortably    Status  Achieved      PT LONG TERM GOAL #2   Title  Pt will demo gross 4+/5 hip and shoulder strength bilaterally for necessary support to biomechanical chain    Baseline  see  flowsheet    Status  Partially Met      PT LONG TERM GOAL #3   Title  pt will demo ability to maintain SLS for 5s without UE suppor to indicate improved balance control    Baseline  able with good control    Status  Achieved      PT LONG TERM GOAL #4   Title  Pt will verbalize ability to utilize new exercises and proper form in her regular workouts    Baseline  able, still working on her program    Status  Achieved            Plan - Oct 07, 2017 1553    Clinical Impression Statement  Pt has made significant improvment in exercise awareness and ability to challenge balance, strength and bone density appropraitely and safely. Pt verbalized readiness for d/c to independent program. Encouraged her to contact us with any further questions.     PT Treatment/Interventions  ADLs/Self Care Home Management;Cryotherapy;Functional mobility training;Stair training;Gait training;Moist Heat;Therapeutic activities;Therapeutic exercise;Balance training;Neuromuscular re-education;Patient/family education;Passive range of motion;Manual techniques     PT Home Exercise Plan  abdominal engagement, iso core press physioball, dead bug, SLR neutral/ER, stretches: figure 4, HSS, thomas;seated physioball exercises       Patient will benefit from skilled therapeutic intervention in order to improve the following deficits and impairments:  Decreased strength, Decreased activity tolerance, Decreased balance  Visit Diagnosis: Muscle weakness (generalized)   G-Codes - 10/07/2017 1557    Functional Assessment Tool Used (Outpatient Only)  strength & balance, clinical judgement    Functional Limitation  Mobility: Walking and moving around    Mobility: Walking and Moving Around Goal Status [phone])  At least 1 percent but less than 20 percent impaired, limited or restricted    Mobility: Walking and Moving Around Discharge Status [phone])  At least 1 percent but less than 20 percent impaired, limited or restricted       Problem List There are no active problems to display for this patient.   PHYSICAL THERAPY DISCHARGE SUMMARY  Visits from Start of Care: 8  Current functional level related to goals / functional outcomes: See above   Remaining deficits: See above   Education / Equipment: Anatomy of condition, POC, HEP, exercise form/rationale  Plan: Patient agrees to discharge.  Patient goals were met. Patient is being discharged due to meeting the stated rehab goals.  ?????     Lennex Pietila C. Brylie Sneath PT, DPT 07-Oct-2017 3:58 PM   [facility] [address] [place], [place], [address] Phone: [phone]   Fax:  [phone]  Name: Evelyn Hurst MRN: [id number] Date of Birth: Aug 29, 1935

## 2018-08-23 ENCOUNTER — Other Ambulatory Visit: Payer: Self-pay | Admitting: Internal Medicine

## 2018-08-23 DIAGNOSIS — M8580 Other specified disorders of bone density and structure, unspecified site: Secondary | ICD-10-CM

## 2018-12-27 ENCOUNTER — Ambulatory Visit
Admission: RE | Admit: 2018-12-27 | Discharge: 2018-12-27 | Disposition: A | Discharge status: Home / Self Care | Payer: Medicare Other | Source: Ambulatory Visit | Attending: Internal Medicine | Admitting: Internal Medicine

## 2018-12-27 DIAGNOSIS — M8580 Other specified disorders of bone density and structure, unspecified site: Secondary | ICD-10-CM

## 2019-10-23 ENCOUNTER — Encounter: Payer: Self-pay | Admitting: Podiatry

## 2019-10-23 ENCOUNTER — Other Ambulatory Visit: Payer: Self-pay

## 2019-10-23 ENCOUNTER — Ambulatory Visit (INDEPENDENT_AMBULATORY_CARE_PROVIDER_SITE_OTHER): Payer: Medicare Other | Admitting: Podiatry

## 2019-10-23 VITALS — BP 139/81 | HR 89 | Resp 16

## 2019-10-23 DIAGNOSIS — M79675 Pain in left toe(s): Secondary | ICD-10-CM | POA: Diagnosis not present

## 2019-10-23 DIAGNOSIS — L603 Nail dystrophy: Secondary | ICD-10-CM | POA: Diagnosis not present

## 2019-10-23 DIAGNOSIS — I739 Peripheral vascular disease, unspecified: Secondary | ICD-10-CM

## 2019-10-23 DIAGNOSIS — M79674 Pain in right toe(s): Secondary | ICD-10-CM

## 2019-10-24 ENCOUNTER — Telehealth: Payer: Self-pay | Admitting: *Deleted

## 2019-10-24 DIAGNOSIS — I739 Peripheral vascular disease, unspecified: Secondary | ICD-10-CM

## 2019-10-24 NOTE — Telephone Encounter (Signed)
Orders faxed to [facility]. 

## 2019-10-24 NOTE — Telephone Encounter (Signed)
-----   Message from Trula Slade, DPM sent at 10/23/2019  4:33 PM EST ----- Can you please order arterial studies? She had ABI in the office today that shows PAD

## 2019-10-30 ENCOUNTER — Other Ambulatory Visit: Payer: Self-pay | Admitting: Podiatry

## 2019-10-30 DIAGNOSIS — I739 Peripheral vascular disease, unspecified: Secondary | ICD-10-CM

## 2019-10-30 NOTE — Progress Notes (Signed)
Subjective:   Patient ID: Evelyn Hurst, female   DOB: 84 y.o.   MRN: [id number]   HPI 84 year old female presents the office today for concerns of her left second toenails becoming thickened discolored as well as becoming tender.  She denies any drainage or pus coming from the areas and she denies any swelling.  The nails are painful with pressure in shoes.  She been keeping antibiotic ointment on the areas daily.   Review of Systems  All other systems reviewed and are negative.  Past Medical History:  Diagnosis Date  . Thyroid disease     Past Surgical History:  Procedure Laterality Date  . APPENDECTOMY    . CATARACT EXTRACTION BILATERAL W/ ANTERIOR VITRECTOMY    . TUBAL LIGATION       Current Outpatient Medications:  .  ciclopirox (PENLAC) 8 % solution, , Disp: , Rfl:  .  tacrolimus (PROTOPIC) 0.1 % ointment, , Disp: , Rfl:  .  Artificial Tear Solution (TEARS NATURALE OP), Apply to eye as needed. , Disp: , Rfl:  .  Diazepam (VALIUM PO), Take by mouth as needed. , Disp: , Rfl:  .  doxycycline (VIBRAMYCIN) 100 MG capsule, Take 100 mg by mouth 2 (two) times daily., Disp: , Rfl:  .  hydrocortisone 1 % ointment, Apply 1 application topically 2 (two) times daily., Disp: , Rfl:  .  Levothyroxine Sodium (SYNTHROID PO), Take by mouth daily. , Disp: , Rfl:  .  Multiple Vitamin (MULTIVITAMIN) tablet, Take 1 tablet by mouth daily., Disp: , Rfl:  .  NUTRITIONAL SUPPLEMENTS PO, Take by mouth., Disp: , Rfl:  .  SYNTHROID 50 MCG tablet, , Disp: , Rfl:  .  SYNTHROID 75 MCG tablet, , Disp: , Rfl:  .  tacrolimus (PROTOPIC) 0.1 % ointment, Apply topically 2 (two) times daily., Disp: , Rfl:   Allergies  Allergen Reactions  . Gold Sodium Thiosulfate Rash    erythematous patches on the eyelids  . Other     Gold sodium thiosulfate Palladium chloride  . Silver Nitrate          Objective:  Physical Exam  General: AAO x3, NAD  Dermatological: Bilateral second digit toenails are  hypertrophic, dystrophic with yellow-brown discoloration and tenderness palpation at the tops of the toenails.  There is minimal edema but there is no drainage or pus or any signs of infection otherwise.  Vascular: DP pulses 1/4, PT pulses 2/4 there is no pain with calf compression, swelling, warmth, erythema.   Neruologic: Grossly intact via light touch bilateral.   Musculoskeletal: No gross boney pedal deformities bilateral. No pain, crepitus, or limitation noted with foot and ankle range of motion bilateral. Muscular strength 5/5 in all groups tested bilateral.  Gait: Unassisted, Nonantalgic.       Assessment:   84 year old female with symptomatic onychodystrophy, concern for PAD    Plan:  -Treatment options discussed including all alternatives, risks, and complications -Etiology of symptoms were discussed -Decreased pulses I did an ABI in the office which was read as "PAD" bilaterally.  Due to this I would order arterial studies.  Also because of concern for arterial insufficiency I would hold off on nail removal that we discussed today.  I did sharply debride the nails without any complications or bleeding.  Continue antibiotic ointment dressing changes daily.  Epson salt soaks. -Monitor for any clinical signs or symptoms of infection and directed to call the office immediately should any occur or go to the ER.  No follow-ups on file.  Trula Slade DPM

## 2019-11-02 ENCOUNTER — Ambulatory Visit (HOSPITAL_COMMUNITY)
Admission: RE | Admit: 2019-11-02 | Discharge: 2019-11-02 | Disposition: A | Discharge status: Home / Self Care | Payer: Medicare Other | Source: Ambulatory Visit | Attending: Cardiology | Admitting: Cardiology

## 2019-11-02 ENCOUNTER — Other Ambulatory Visit: Payer: Self-pay

## 2019-11-02 DIAGNOSIS — I739 Peripheral vascular disease, unspecified: Secondary | ICD-10-CM

## 2019-11-08 ENCOUNTER — Telehealth: Payer: Self-pay | Admitting: *Deleted

## 2019-11-08 NOTE — Telephone Encounter (Signed)
-----   Message from Trula Slade, DPM sent at 11/08/2019  1:49 PM EST ----- Val- please let her know that the circulation test does not show any marjory circulation issue but it does show decreased circulation to the toes. We need to watch this but likely no intervention warranted at this time. Would recommend to hold off on nail removal unless absolutely needed.

## 2019-11-08 NOTE — Telephone Encounter (Signed)
I informed pt of Dr. Leigh Aurora review of results and recommendations. Pt states she didn't want a toenail procedure, just to set up for the routine care and treatment for fungus. I told pt I would have someone call her to schedule 11/13/2019.

## 2019-12-04 ENCOUNTER — Ambulatory Visit: Payer: Medicare Other | Admitting: Podiatry

## 2020-01-07 ENCOUNTER — Ambulatory Visit: Payer: Medicare Other | Attending: Internal Medicine

## 2020-01-07 DIAGNOSIS — Z23 Encounter for immunization: Secondary | ICD-10-CM | POA: Insufficient documentation

## 2020-01-07 NOTE — Progress Notes (Signed)
   Covid-19 Vaccination Clinic  Name:  Evelyn Hurst    MRN: [id number] DOB: November 04, 1935  01/07/2020  Evelyn Hurst was observed post Covid-19 immunization for 15 minutes without incidence. She was provided with Vaccine Information Sheet and instruction to access the V-Safe system.   Evelyn Hurst was instructed to call 911 with any severe reactions post vaccine: Marland Kitchen Difficulty breathing  . Swelling of your face and throat  . A fast heartbeat  . A bad rash all over your body  . Dizziness and weakness    Immunizations Administered    Name Date Dose VIS Date Route   Pfizer COVID-19 Vaccine 01/07/2020  8:14 AM 0.3 mL 10/27/2019 Intramuscular   Manufacturer: [place]   Lot: [id number]   [place]: [id number]

## 2020-01-31 ENCOUNTER — Ambulatory Visit: Payer: Medicare Other | Attending: Internal Medicine

## 2020-01-31 DIAGNOSIS — Z23 Encounter for immunization: Secondary | ICD-10-CM

## 2020-01-31 NOTE — Progress Notes (Signed)
   Covid-19 Vaccination Clinic  Name:  MIANNA BOSMAN    MRN: [id number] DOB: [birth year]  01/31/2020  Ms. Hollrah was observed post Covid-19 immunization for 15 minutes without incident. She was provided with Vaccine Information Sheet and instruction to access the V-Safe system.   Ms. Fenton was instructed to call 911 with any severe reactions post vaccine: Marland Kitchen Difficulty breathing  . Swelling of face and throat  . A fast heartbeat  . A bad rash all over body  . Dizziness and weakness   Immunizations Administered    Name Date Dose VIS Date Route   Pfizer COVID-19 Vaccine 01/31/2020  8:11 AM 0.3 mL 10/27/2019 Intramuscular   Manufacturer: [place]   Lot: [id number]   [place]: [id number]

## 2020-03-19 ENCOUNTER — Other Ambulatory Visit: Payer: Self-pay

## 2020-03-19 ENCOUNTER — Encounter: Payer: Self-pay | Admitting: Podiatry

## 2020-03-19 ENCOUNTER — Ambulatory Visit: Payer: Medicare Other | Admitting: Podiatry

## 2020-03-19 DIAGNOSIS — M79674 Pain in right toe(s): Secondary | ICD-10-CM

## 2020-03-19 DIAGNOSIS — B351 Tinea unguium: Secondary | ICD-10-CM | POA: Diagnosis not present

## 2020-03-19 DIAGNOSIS — M79675 Pain in left toe(s): Secondary | ICD-10-CM

## 2020-03-25 NOTE — Progress Notes (Signed)
Subjective: 85 year old female presents the office with concerns of thick, discolored, elongated toenails that she cannot trim her self. She also presents to discuss arterial studies. We had previously discussed nail removal but I had circulation test performed. She was told when she had the test that her test was normal and that she received a call but they were abnormal and she is confused by this. Denies any systemic complaints such as fevers, chills, nausea, vomiting. No acute changes since last appointment, and no other complaints at this time.   Objective: AAO x3, NAD DP/PT pulses palpable bilaterally, CRT less than 3 seconds Nails are hypertrophic, dystrophic, brittle, discolored, elongated 10. No surrounding redness or drainage. Tenderness nails 1-5 bilaterally. No open lesions or pre-ulcerative lesions are identified today.  No open lesions or pre-ulcerative lesions.  No pain with calf compression, swelling, warmth, erythema  Assessment: 85 year old female with symptomatic onychomycosis  Plan: -All treatment options discussed with the patient including all alternatives, risks, complications.  -I need arterial studies with her. There is microvascular disease. Currently after discussion she wants to hold off on nail removal and I agree with this given the decreased pressure to the digits. If symptoms worsen we can always remove the nail if needed. Today sharply debrided nails x10 without any complications or bleeding.  Return in about 3 months (around 06/19/2020).  Trula Slade DPM  -Patient encouraged to call the office with any questions, concerns, change in symptoms.

## 2020-05-23 ENCOUNTER — Encounter (HOSPITAL_COMMUNITY): Payer: Self-pay

## 2020-05-23 ENCOUNTER — Emergency Department (HOSPITAL_COMMUNITY): Payer: Medicare Other

## 2020-05-23 ENCOUNTER — Emergency Department (HOSPITAL_COMMUNITY)
Admission: EM | Admit: 2020-05-23 | Discharge: 2020-05-24 | Disposition: A | Discharge status: Home / Self Care | Payer: Medicare Other | Attending: Emergency Medicine | Admitting: Emergency Medicine

## 2020-05-23 DIAGNOSIS — Z79899 Other long term (current) drug therapy: Secondary | ICD-10-CM | POA: Insufficient documentation

## 2020-05-23 DIAGNOSIS — N23 Unspecified renal colic: Secondary | ICD-10-CM | POA: Insufficient documentation

## 2020-05-23 DIAGNOSIS — N132 Hydronephrosis with renal and ureteral calculous obstruction: Secondary | ICD-10-CM | POA: Insufficient documentation

## 2020-05-23 DIAGNOSIS — R1031 Right lower quadrant pain: Secondary | ICD-10-CM | POA: Diagnosis present

## 2020-05-23 DIAGNOSIS — Z87891 Personal history of nicotine dependence: Secondary | ICD-10-CM | POA: Insufficient documentation

## 2020-05-23 LAB — COMPREHENSIVE METABOLIC PANEL
ALT: 19 U/L (ref 0–44)
AST: 25 U/L (ref 15–41)
Albumin: 3.7 g/dL (ref 3.5–5.0)
Alkaline Phosphatase: 59 U/L (ref 38–126)
Anion gap: 12 (ref 5–15)
BUN: 20 mg/dL (ref 8–23)
CO2: 20 mmol/L — ABNORMAL LOW (ref 22–32)
Calcium: 9.1 mg/dL (ref 8.9–10.3)
Chloride: 107 mmol/L (ref 98–111)
Creatinine, Ser: 1.05 mg/dL — ABNORMAL HIGH (ref 0.44–1.00)
GFR calc Af Amer: 57 mL/min — ABNORMAL LOW (ref >60)
GFR calc non Af Amer: 49 mL/min — ABNORMAL LOW (ref >60)
Glucose, Bld: 155 mg/dL — ABNORMAL HIGH (ref 70–99)
Potassium: 4.3 mmol/L (ref 3.5–5.1)
Sodium: 139 mmol/L (ref 135–145)
Total Bilirubin: 0.5 mg/dL (ref 0.3–1.2)
Total Protein: 6.8 g/dL (ref 6.5–8.1)

## 2020-05-23 LAB — I-STAT CHEM 8, ED
BUN: 22 mg/dL (ref 8–23)
Calcium, Ion: 0.99 mmol/L — ABNORMAL LOW (ref 1.15–1.40)
Chloride: 108 mmol/L (ref 98–111)
Creatinine, Ser: 0.9 mg/dL (ref 0.44–1.00)
Glucose, Bld: 142 mg/dL — ABNORMAL HIGH (ref 70–99)
HCT: 39 % (ref 36.0–46.0)
Hemoglobin: 13.3 g/dL (ref 12.0–15.0)
Potassium: 3.9 mmol/L (ref 3.5–5.1)
Sodium: 140 mmol/L (ref 135–145)
TCO2: 20 mmol/L — ABNORMAL LOW (ref 22–32)

## 2020-05-23 LAB — CBC WITH DIFFERENTIAL/PLATELET
Abs Immature Granulocytes: 0.04 10*3/uL (ref 0.00–0.07)
Basophils Absolute: 0.1 10*3/uL (ref 0.0–0.1)
Basophils Relative: 0 %
Eosinophils Absolute: 0 10*3/uL (ref 0.0–0.5)
Eosinophils Relative: 0 %
HCT: 43 % (ref 36.0–46.0)
Hemoglobin: 14.1 g/dL (ref 12.0–15.0)
Immature Granulocytes: 0 %
Lymphocytes Relative: 11 %
Lymphs Abs: 1.3 10*3/uL (ref 0.7–4.0)
MCH: 31.4 pg (ref 26.0–34.0)
MCHC: 32.8 g/dL (ref 30.0–36.0)
MCV: 95.8 fL (ref 80.0–100.0)
Monocytes Absolute: 0.3 10*3/uL (ref 0.1–1.0)
Monocytes Relative: 3 %
Neutro Abs: 9.5 10*3/uL — ABNORMAL HIGH (ref 1.7–7.7)
Neutrophils Relative %: 86 %
Platelets: 177 10*3/uL (ref 150–400)
RBC: 4.49 MIL/uL (ref 3.87–5.11)
RDW: 13.6 % (ref 11.5–15.5)
WBC: 11.1 10*3/uL — ABNORMAL HIGH (ref 4.0–10.5)
nRBC: 0 % (ref 0.0–0.2)

## 2020-05-23 LAB — LIPASE, BLOOD: Lipase: 25 U/L (ref 11–51)

## 2020-05-23 MED ORDER — KETOROLAC TROMETHAMINE 30 MG/ML IJ SOLN: 30.0000 mg | Freq: Once | INTRAMUSCULAR | Status: DC

## 2020-05-23 MED ORDER — ONDANSETRON 4 MG PO TBDP: ORAL_TABLET | ORAL | 0 refills | Status: DC

## 2020-05-23 MED ORDER — TAMSULOSIN HCL 0.4 MG PO CAPS: 0.4000 mg | ORAL_CAPSULE | Freq: Every day | ORAL | 0 refills | Status: DC

## 2020-05-23 MED ORDER — MORPHINE SULFATE (PF) 4 MG/ML IV SOLN
4.0000 mg | Freq: Once | INTRAVENOUS | Status: AC
  Administered 2020-05-23: 4 mg via INTRAVENOUS
  Filled 2020-05-23: qty 1

## 2020-05-23 MED ORDER — SODIUM CHLORIDE 0.9 % IV BOLUS
1000.0000 mL | Freq: Once | INTRAVENOUS | Status: AC
  Administered 2020-05-23: 1000 mL via INTRAVENOUS

## 2020-05-23 MED ORDER — HYDROMORPHONE HCL 1 MG/ML IJ SOLN
1.0000 mg | Freq: Once | INTRAMUSCULAR | Status: AC
  Administered 2020-05-23: 1 mg via INTRAVENOUS
  Filled 2020-05-23: qty 1

## 2020-05-23 MED ORDER — KETOROLAC TROMETHAMINE 60 MG/2ML IM SOLN
60.0000 mg | Freq: Once | INTRAMUSCULAR | Status: AC
  Administered 2020-05-23: 60 mg via INTRAMUSCULAR
  Filled 2020-05-23: qty 2

## 2020-05-23 MED ORDER — HYDROCODONE-ACETAMINOPHEN 5-325 MG PO TABS: 1.0000 | ORAL_TABLET | Freq: Four times a day (QID) | ORAL | 0 refills | Status: DC | PRN

## 2020-05-23 NOTE — ED Notes (Signed)
Pt given water per Darl Householder, MD. Pt instructed to use call bell once she has peed so a collection can be made.

## 2020-05-23 NOTE — ED Provider Notes (Signed)
[facility] EMERGENCY DEPARTMENT Provider Note   CSN: [id number] Arrival date & time: 05/23/20  2132     History Chief Complaint  Patient presents with  . Abdominal Pain    Evelyn Hurst is a 85 y.o. female hx of hypothyroidism, previous appendectomy here presenting with right lower quadrant pain.  Patient states that she has sharp right lower quadrant pain that radiates to the right flank acute onset at 4 PM.  She states that no position is comfortable for her.  She states that she has some trouble urinating since then.  Denies any blood in her urine.  Denies any history of kidney stones.  She has some associated nausea vomiting but no fevers.  Patient was given 100 mcg of fentanyl by EMS prior to arrival.  The history is provided by the patient.       Past Medical History:  Diagnosis Date  . Thyroid disease     Patient Active Problem List   Diagnosis Date Noted  . Allergic contact dermatitis 05/10/2012  . Dermatitis 05/04/2012    Past Surgical History:  Procedure Laterality Date  . APPENDECTOMY    . CATARACT EXTRACTION BILATERAL W/ ANTERIOR VITRECTOMY    . TUBAL LIGATION       OB History   No obstetric history on file.     Family History  Problem Relation Age of Onset  . Diabetes Mother   . Heart disease Father     Social History   Tobacco Use  . Smoking status: Former Research scientist (life sciences)  . Smokeless tobacco: Never Used  Substance Use Topics  . Alcohol use: Yes    Alcohol/week: 7.0 standard drinks    Types: 7 Standard drinks or equivalent per week    Comment: wine  . Drug use: Not on file    Home Medications Prior to Admission medications   Medication Sig Start Date End Date Taking? Authorizing Provider  SYNTHROID 50 MCG tablet Take 50 mcg by mouth every Monday, Tuesday, Wednesday, Thursday, and Friday.  10/15/19  Yes [provider]  SYNTHROID 75 MCG tablet Take 75 mcg by mouth 2 (two) times a week. Saturday and Sunday 09/17/19   Yes [provider]  HYDROcodone-acetaminophen (NORCO/VICODIN) 5-325 MG tablet Take 1 tablet by mouth every 6 (six) hours as needed. 05/23/20   Drenda Freeze, MD  ondansetron (ZOFRAN ODT) 4 MG disintegrating tablet 4mg  ODT q4 hours prn nausea/vomit 05/23/20   Drenda Freeze, MD  tamsulosin (FLOMAX) 0.4 MG CAPS capsule Take 1 capsule (0.4 mg total) by mouth daily after supper. 05/23/20   Drenda Freeze, MD    Allergies    Gold sodium thiosulfate, Other, and Silver nitrate  Review of Systems   Review of Systems  Gastrointestinal: Positive for abdominal pain.  All other systems reviewed and are negative.   Physical Exam Updated Vital Signs BP 139/65   Pulse 71   Temp 98.4 F (36.9 C) (Oral)   Resp 13   Ht 5\' 3"  (1.6 m)   Wt 71.2 kg   SpO2 93%   BMI 27.81 kg/m   Physical Exam Vitals and nursing note reviewed.  Constitutional:      Comments: Uncomfortable   HENT:     Head: Normocephalic.     Mouth/Throat:     Mouth: Mucous membranes are moist.  Eyes:     Extraocular Movements: Extraocular movements intact.  Cardiovascular:     Rate and Rhythm: Normal rate and regular rhythm.  Heart sounds: Normal heart sounds.  Pulmonary:     Effort: Pulmonary effort is normal.     Breath sounds: Normal breath sounds.  Abdominal:     General: Abdomen is flat.     Comments: Mild RLQ tenderness and R CVAT   Skin:    General: Skin is warm.     Capillary Refill: Capillary refill takes less than 2 seconds.  Neurological:     General: No focal deficit present.     Mental Status: She is alert.     ED Results / Procedures / Treatments   Labs (all labs ordered are listed, but only abnormal results are displayed) Labs Reviewed  CBC WITH DIFFERENTIAL/PLATELET - Abnormal; Notable for the following components:      Result Value   WBC 11.1 (*)    Neutro Abs 9.5 (*)    All other components within normal limits  COMPREHENSIVE METABOLIC PANEL - Abnormal; Notable for the  following components:   CO2 20 (*)    Glucose, Bld 155 (*)    Creatinine, Ser 1.05 (*)    GFR calc non Af Amer 49 (*)    GFR calc Af Amer 57 (*)    All other components within normal limits  I-STAT CHEM 8, ED - Abnormal; Notable for the following components:   Glucose, Bld 142 (*)    Calcium, Ion 0.99 (*)    TCO2 20 (*)    All other components within normal limits  URINE CULTURE  LIPASE, BLOOD  URINALYSIS, ROUTINE W REFLEX MICROSCOPIC    EKG None  Radiology CT Renal Stone Study  Result Date: 05/23/2020 CLINICAL DATA:  Right flank pain and nausea EXAM: CT ABDOMEN AND PELVIS WITHOUT CONTRAST TECHNIQUE: Multidetector CT imaging of the abdomen and pelvis was performed following the standard protocol without IV contrast. COMPARISON:  CT 08/05/2009 FINDINGS: Lower chest: Atelectatic changes in both lung bases predominantly medially adjacent enlarging hiatal hernia. Normal cardiac size. Hepatobiliary: No visible liver lesions on this unenhanced CT. Smooth surface contour. Normal liver attenuation. Normal gallbladder and biliary tree without visible calcified gallstone. Pancreas: Unremarkable. No pancreatic ductal dilatation or surrounding inflammatory changes. Spleen: Normal in size without focal abnormality. Adrenals/Urinary Tract: Normal adrenal glands. There is asymmetrically increased right perinephric and periureteral stranding with mild to moderate hydroureteronephrosis to the level of a 3 mm calculus at the distal right ureter (3/72) just proximal to the ureterovesicular junction. Findings on a background of additional parapelvic cysts. A small amount of free fluid in the perinephric space could suggest a forniceal rupture. No other collecting system calculi. Additional left parapelvic cysts are present. Mild left perinephric stranding is similar to the comparison. No worrisome renal lesions are identified. Urinary bladder is largely decompressed at the time of exam and therefore poorly  evaluated by CT imaging. No gross bladder abnormality is seen. Stomach/Bowel: Enlarging hiatal hernia which is now moderate in size. More distal stomach has a normal appearance. Thickening along the duodenum is likely distributed from the renal process above. Distal duodenum has a more normal appearance. No small bowel thickening or dilatation. The appendix is surgically absent. No colonic dilatation or wall thickening. Scattered colonic diverticula without focal inflammation to suggest diverticulitis. Vascular/Lymphatic: Atherosclerotic calcifications within the abdominal aorta and branch vessels. No aneurysm or ectasia. No enlarged abdominopelvic lymph nodes. Reproductive: Retroflexed uterus.  No concerning adnexal lesions. Other: Right perinephric stranding and small volume of free fluid in the retroperitoneum, suggestive of a forniceal rupture as detailed above. No other  abdominopelvic free fluid or air. Hiatal hernia, as above without other bowel containing hernias. Small fat containing umbilical hernia. Musculoskeletal: The osseous structures appear diffusely demineralized which may limit detection of small or nondisplaced fractures. Multilevel degenerative changes are present in the imaged portions of the spine. Dextrocurvature of the thoracolumbar junction similar to prior. Sclerotic, likely degenerative Modic type endplate changes, are advanced from the prior exam. Additional degenerative changes in the hips and pelvis. No acute or worrisome osseous lesions. IMPRESSION: 1. Obstructing 3 mm calculus at the distal right ureter just proximal to the ureterovesicular junction, with associated mild to moderate right hydroureteronephrosis, right perinephric stranding and small volume of free fluid in the retroperitoneum, suggestive of a forniceal rupture. 2. Enlarging hiatal hernia which is now moderate in size. 3. Colonic diverticulosis without evidence of diverticulitis. 4. Aortic Atherosclerosis (ICD10-I70.0).  Electronically Signed   By: Lovena Le M.D.   On: 05/23/2020 22:33    Procedures Procedures (including critical care time)  Medications Ordered in ED Medications  sodium chloride 0.9 % bolus 1,000 mL (1,000 mLs Intravenous New Bag/Given 05/23/20 2205)  morphine 4 MG/ML injection 4 mg (4 mg Intravenous Given 05/23/20 2228)  HYDROmorphone (DILAUDID) injection 1 mg (1 mg Intravenous Given 05/23/20 2306)  ketorolac (TORADOL) injection 60 mg (60 mg Intramuscular Given 05/23/20 2306)    ED Course  I have reviewed the triage vital signs and the nursing notes.  Pertinent labs & imaging results that were available during my care of the patient were reviewed by me and considered in my medical decision making (see chart for details).    MDM Rules/Calculators/A&P                          SAIRAH KNOBLOCH is a 85 y.o. female here presenting with right lower quadrant pain.  Concern for possible renal colic.  Plan to get CBC, CMP, UA.  Will get CT renal stone study.  11:37 PM CT renal stone showed 3 mm stone with moderate hydro and some possible forniceal rupture with free fluid in the retroperitoneum.  Patient's pain slightly improved with pain meds.  I discussed case with urologist on-call, Dr. Milford Cage.  He states that this case should be treated as a 3 mm stone with hydro-.  He is not concerned about the free fluid in the retroperitoneum.  He recommend IM Toradol. As long as the UA does not show any UTI, patient can be discharged. Patient will be seen in the urology office in 1 to 2 days per Dr. Milford Cage.  The pain is not under control, patient can be admitted by the hospital service and urology will see patient in the morning.  Signed out to Dr. Dolly Rias in the ED.    Final Clinical Impression(s) / ED Diagnoses Final diagnoses:  Renal colic on right side  Hydronephrosis with urinary obstruction due to ureteral calculus    Rx / DC Orders ED Discharge Orders         Ordered     HYDROcodone-acetaminophen (NORCO/VICODIN) 5-325 MG tablet  Every 6 hours PRN     Discontinue  Reprint     05/23/20 2331    tamsulosin (FLOMAX) 0.4 MG CAPS capsule  Daily after supper     Discontinue  Reprint     05/23/20 2331    ondansetron (ZOFRAN ODT) 4 MG disintegrating tablet     Discontinue  Reprint     05/23/20 2332  Drenda Freeze, MD 05/23/20 [phone]

## 2020-05-23 NOTE — Discharge Instructions (Addendum)
Take motrin for pain   Take vicodin for severe pain   Take flomax   Take zofran for nausea   See urologist for follow up in 1-2 days   Return to ER if you have fever, severe pain, vomiting, unable to urinate

## 2020-05-23 NOTE — ED Triage Notes (Addendum)
Pt BIB EMS from home with husband, pt noted abd pain at 1600 today, that started as RLQ dull pain , pain radiating to flank and back. Pt endorse n/v , and unable to urinate since pain started  EMS gave fentanyl 118mcg and zofran 4 mg

## 2020-05-24 LAB — URINALYSIS, ROUTINE W REFLEX MICROSCOPIC
Bacteria, UA: NONE SEEN
Bilirubin Urine: NEGATIVE
Glucose, UA: 50 mg/dL — AB
Ketones, ur: 20 mg/dL — AB
Nitrite: NEGATIVE
Protein, ur: NEGATIVE mg/dL
Specific Gravity, Urine: 1.014 (ref 1.005–1.030)
pH: 6 (ref 5.0–8.0)

## 2020-05-24 NOTE — ED Provider Notes (Signed)
3:19 AM Assumed care from Dr. Darl Householder, please see their note for full history, physical and decision making until this point. In brief this is a 85 y.o. year old female who presented to the ED tonight with Abdominal Pain     Any stone with renal colic.  I discussed with urology secondary to the forniceal rupture and will see outpatient but does not require inpatient admission as well and pain is controlled no urinary tract infection.  Patient's pain has been controlled for 3 hours now and urine is not infected.  Culture will be sent.  Otherwise stable for discharge.  Discharge instructions, including strict return precautions for new or worsening symptoms, given. Patient and/or family verbalized understanding and agreement with the plan as described.   Labs, studies and imaging reviewed by myself and considered in medical decision making if ordered. Imaging interpreted by radiology.  Labs Reviewed  CBC WITH DIFFERENTIAL/PLATELET - Abnormal; Notable for the following components:      Result Value   WBC 11.1 (*)    Neutro Abs 9.5 (*)    All other components within normal limits  COMPREHENSIVE METABOLIC PANEL - Abnormal; Notable for the following components:   CO2 20 (*)    Glucose, Bld 155 (*)    Creatinine, Ser 1.05 (*)    GFR calc non Af Amer 49 (*)    GFR calc Af Amer 57 (*)    All other components within normal limits  URINALYSIS, ROUTINE W REFLEX MICROSCOPIC - Abnormal; Notable for the following components:   Color, Urine STRAW (*)    Glucose, UA 50 (*)    Hgb urine dipstick SMALL (*)    Ketones, ur 20 (*)    Leukocytes,Ua TRACE (*)    All other components within normal limits  I-STAT CHEM 8, ED - Abnormal; Notable for the following components:   Glucose, Bld 142 (*)    Calcium, Ion 0.99 (*)    TCO2 20 (*)    All other components within normal limits  URINE CULTURE  LIPASE, BLOOD    CT Renal Stone Study  Final Result      No follow-ups on file.    Vernita Tague, Corene Cornea,  MD 05/24/20 [phone]

## 2020-05-25 LAB — URINE CULTURE: Culture: NO GROWTH

## 2020-06-19 ENCOUNTER — Ambulatory Visit: Payer: Medicare Other | Admitting: Podiatry

## 2020-06-19 ENCOUNTER — Other Ambulatory Visit: Payer: Self-pay

## 2020-06-19 ENCOUNTER — Encounter: Payer: Self-pay | Admitting: Podiatry

## 2020-06-19 DIAGNOSIS — B351 Tinea unguium: Secondary | ICD-10-CM

## 2020-06-19 DIAGNOSIS — M79674 Pain in right toe(s): Secondary | ICD-10-CM

## 2020-06-19 DIAGNOSIS — M79675 Pain in left toe(s): Secondary | ICD-10-CM

## 2020-06-19 DIAGNOSIS — I739 Peripheral vascular disease, unspecified: Secondary | ICD-10-CM | POA: Diagnosis not present

## 2020-06-20 NOTE — Progress Notes (Signed)
Subjective:  Patient ID: Evelyn Hurst, female    DOB: 12-Mar-1935,  MRN: [id number]  Evelyn Hurst presents to clinic today for painful thick toenails that are difficult to trim. Pain interferes with ambulation. Aggravating factors include wearing enclosed shoe gear. Pain is relieved with periodic professional debridement.   She has LE microvascular disease confirmed by noninvasive vascular studies in December, 2020.  Review of Systems: Negative except as noted in the HPI. Past Medical History:  Diagnosis Date  . Thyroid disease    Past Surgical History:  Procedure Laterality Date  . APPENDECTOMY    . CATARACT EXTRACTION BILATERAL W/ ANTERIOR VITRECTOMY    . TUBAL LIGATION      Current Outpatient Medications:  .  HYDROcodone-acetaminophen (NORCO/VICODIN) 5-325 MG tablet, Take 1 tablet by mouth every 6 (six) hours as needed., Disp: 12 tablet, Rfl: 0 .  ondansetron (ZOFRAN ODT) 4 MG disintegrating tablet, 4mg  ODT q4 hours prn nausea/vomit, Disp: 10 tablet, Rfl: 0 .  SYNTHROID 50 MCG tablet, Take 50 mcg by mouth every Monday, Tuesday, Wednesday, Thursday, and Friday. , Disp: , Rfl:  .  SYNTHROID 75 MCG tablet, Take 75 mcg by mouth 2 (two) times a week. Saturday and Sunday, Disp: , Rfl:  .  tamsulosin (FLOMAX) 0.4 MG CAPS capsule, Take 1 capsule (0.4 mg total) by mouth daily after supper., Disp: 10 capsule, Rfl: 0 Allergies  Allergen Reactions  . Gold Sodium Thiosulfate Rash    erythematous patches on the eyelids  . Other     Gold sodium thiosulfate Palladium chloride  . Silver Nitrate    Social History   Occupational History  . Occupation: semi retired Engineer, maintenance ([employer])  Tobacco Use  . Smoking status: Former Research scientist (life sciences)  . Smokeless tobacco: Never Used  Substance and Sexual Activity  . Alcohol use: Yes    Alcohol/week: 7.0 standard drinks    Types: 7 Standard drinks or equivalent per week    Comment: wine  . Drug use: Not on file  . Sexual activity: Not on file    Objective:    Constitutional Evelyn Hurst is a pleasant 85 y.o. Caucasian female, in NAD.Marland Kitchen AAO x 3.   Vascular Capillary refill time to digits immediate b/l. Palpable pedal pulses b/l LE. Pedal hair sparse. Lower extremity skin temperature gradient within normal limits. No pain with calf compression b/l. No edema noted b/l lower extremities.  No cyanosis or clubbing noted.  Neurologic Normal speech. Oriented to person, place, and time. Epicritic sensation to light touch grossly present bilaterally. Protective sensation intact 5/5 intact bilaterally with 10g monofilament b/l. Vibratory sensation intact b/l.  Dermatologic Pedal skin with normal turgor, texture and tone bilaterally. No open wounds bilaterally. No interdigital macerations bilaterally. Toenails 1-5 b/l elongated, discolored, dystrophic, thickened, crumbly with subungual debris and tenderness to dorsal palpation.  Orthopedic: Normal muscle strength 5/5 to all lower extremity muscle groups bilaterally. No pain crepitus or joint limitation noted with ROM b/l. No gross bony deformities bilaterally.   ABIs LE Arterial segmentals:  11/02/2019  Indications: Patient referred by Dr. Jacqualyn Posey due to decreased pulses/abnormal POCT ABI screening exam. Patient was seen for left second toenail becoming thickened/discolored and tender. She denies any claudication symptoms, rest pain or lower extremity symptoms. High Risk Factors: Past history of smoking. Performing Technologist: Mariane Masters RVT Examination Guidelines: A complete evaluation includes at minimum, Doppler waveform signals and systolic blood pressure reading at the level of bilateral brachial, anterior tibial, and posterior tibial arteries, when vessel segments are  accessible. Bilateral testing is considered an integral part of a complete examination. Photoelectric Plethysmograph (PPG) waveforms and toe systolic pressure readings are included as required and additional duplex testing as  needed. Limited examinations for reoccurring indications may be performed as noted. Right Rt Pressure (mmHg) Index Waveform Comment Brachial  156 CFA   triphasic Popliteal   triphasic ATA   136  0.84  monophasic PTA   172  1.07  triphasic PERO   153  0.95  triphasic Great Toe    64  0.40  Abnormal Left Lt Pressure (mmHg) Index Waveform Comment Brachial  161 CFA   triphasic Popliteal  triphasic ATA   161  1.00  triphasic PTA   166  1.03  triphasic PERO   149  0.93  biphasic Great Toe  80  0.50 ABI/TBI Today's ABI Today's TBI Previous ABI Previous TBI Right             1.07   0.40 Left                 1.03      0.50  Right Toes Pressure (mmHg) Waveform Comment 1st Digit  Abnormal 2nd Digit  Abnormal 3rd Digit Abnormal 4th Digit  Abnormal 5th Digit  Abnormal Final  Radiographs: None Assessment:   1. Pain in toes of both feet   2. PAD (peripheral artery disease) ([place])    Plan:  Patient was evaluated and treated and all questions answered.  Onychomycosis with pain -Nails palliatively debridement as below -Educated on self-care  Procedure: Nail Debridement Rationale: Pain Type of Debridement: manual, sharp debridement. Instrumentation: Nail nipper, rotary burr. Number of Nails: 10 -Examined patient. -Toenails 1-5 b/l were debrided in length and girth with sterile nail nippers and dremel without iatrogenic bleeding.  -Patient to report any pedal injuries to medical professional immediately. -Patient to continue soft, supportive shoe gear daily. -Patient/POA to call should there be question/concern in the interim.  Return in about 3 months (around 09/19/2020) for nail trim.  Marzetta Board, DPM

## 2020-09-25 ENCOUNTER — Other Ambulatory Visit: Payer: Self-pay

## 2020-09-25 ENCOUNTER — Ambulatory Visit: Payer: Medicare Other | Admitting: Podiatry

## 2020-09-25 ENCOUNTER — Encounter: Payer: Self-pay | Admitting: Podiatry

## 2020-09-25 DIAGNOSIS — I739 Peripheral vascular disease, unspecified: Secondary | ICD-10-CM

## 2020-09-25 DIAGNOSIS — M79675 Pain in left toe(s): Secondary | ICD-10-CM | POA: Diagnosis not present

## 2020-09-25 DIAGNOSIS — M79674 Pain in right toe(s): Secondary | ICD-10-CM | POA: Diagnosis not present

## 2020-09-29 NOTE — Progress Notes (Signed)
Subjective:  Patient ID: Evelyn Hurst, female    DOB: Jul 25, 1935,  MRN: [id number]  Evelyn Hurst presents to clinic today for painful thick toenails that are difficult to trim. Pain interferes with ambulation. Aggravating factors include wearing enclosed shoe gear. Pain is relieved with periodic professional debridement.   She has LE microvascular disease confirmed by noninvasive vascular studies in December, 2020.  She states she has used [employer] Rub on her toenails in the past and is considering restarting it.  She voices no new pedal problems on today's visit.  Review of Systems: Negative except as noted in the HPI. Past Medical History:  Diagnosis Date  . Thyroid disease    Past Surgical History:  Procedure Laterality Date  . APPENDECTOMY    . CATARACT EXTRACTION BILATERAL W/ ANTERIOR VITRECTOMY    . TUBAL LIGATION      Current Outpatient Medications:  .  HYDROcodone-acetaminophen (NORCO/VICODIN) 5-325 MG tablet, Take 1 tablet by mouth every 6 (six) hours as needed., Disp: 12 tablet, Rfl: 0 .  ondansetron (ZOFRAN ODT) 4 MG disintegrating tablet, 4mg  ODT q4 hours prn nausea/vomit, Disp: 10 tablet, Rfl: 0 .  SYNTHROID 50 MCG tablet, Take 50 mcg by mouth every Monday, Tuesday, Wednesday, Thursday, and Friday. , Disp: , Rfl:  .  SYNTHROID 75 MCG tablet, Take 75 mcg by mouth 2 (two) times a week. Saturday and Sunday, Disp: , Rfl:  .  tamsulosin (FLOMAX) 0.4 MG CAPS capsule, Take 1 capsule (0.4 mg total) by mouth daily after supper., Disp: 10 capsule, Rfl: 0 Allergies  Allergen Reactions  . Gold Sodium Thiosulfate Rash    erythematous patches on the eyelids  . Other     Gold sodium thiosulfate Palladium chloride  . Silver Nitrate    Social History   Occupational History  . Occupation: semi retired Engineer, maintenance ([employer])  Tobacco Use  . Smoking status: Former Research scientist (life sciences)  . Smokeless tobacco: Never Used  Substance and Sexual Activity  . Alcohol use: Yes    Alcohol/week: 7.0 standard  drinks    Types: 7 Standard drinks or equivalent per week    Comment: wine  . Drug use: Not on file  . Sexual activity: Not on file    Objective:   Constitutional Evelyn Hurst is a pleasant 85 y.o. Caucasian female, in NAD.  AAO x 3.   Vascular Capillary refill time to digits immediate b/l. Palpable pedal pulses b/l LE. Pedal hair sparse. Lower extremity skin temperature gradient within normal limits. No pain with calf compression b/l. No edema noted b/l lower extremities.  No cyanosis or clubbing noted.  Neurologic Normal speech. Oriented to person, place, and time. Epicritic sensation to light touch grossly present bilaterally. Protective sensation intact 5/5 intact bilaterally with 10g monofilament b/l. Vibratory sensation intact b/l.  Dermatologic Pedal skin with normal turgor, texture and tone bilaterally. No open wounds bilaterally. No interdigital macerations bilaterally. Toenails 1-5 b/l elongated, discolored, dystrophic, thickened, crumbly with subungual debris and tenderness to dorsal palpation.  Orthopedic: Normal muscle strength 5/5 to all lower extremity muscle groups bilaterally. No pain crepitus or joint limitation noted with ROM b/l. No gross bony deformities bilaterally.  Radiographs: None Assessment:   1. Pain in toes of both feet   2. PAD (peripheral artery disease) ([place])    Plan:  Patient was evaluated and treated and all questions answered.  Onychomycosis with pain -Nails palliatively debridement as below -Educated on self-care  Procedure: Nail Debridement Rationale: Pain Type of Debridement: manual, sharp  debridement. Instrumentation: Nail nipper, rotary burr. Number of Nails: 10 -Examined patient. -Toenails 1-5 b/l were debrided in length and girth with sterile nail nippers and dremel without iatrogenic bleeding.  -Patient to report any pedal injuries to medical professional immediately. -Patient to continue soft, supportive shoe gear  daily. -Patient/POA to call should there be question/concern in the interim.  Return in about 3 months (around 12/26/2020) for painful mycotic toenails.  Marzetta Board, DPM

## 2020-11-22 DIAGNOSIS — R739 Hyperglycemia, unspecified: Secondary | ICD-10-CM | POA: Diagnosis not present

## 2020-11-22 DIAGNOSIS — R030 Elevated blood-pressure reading, without diagnosis of hypertension: Secondary | ICD-10-CM | POA: Diagnosis not present

## 2020-11-22 DIAGNOSIS — E039 Hypothyroidism, unspecified: Secondary | ICD-10-CM | POA: Diagnosis not present

## 2020-11-22 DIAGNOSIS — E7800 Pure hypercholesterolemia, unspecified: Secondary | ICD-10-CM | POA: Diagnosis not present

## 2020-11-22 DIAGNOSIS — Z0000 Encounter for general adult medical examination without abnormal findings: Secondary | ICD-10-CM | POA: Diagnosis not present

## 2020-11-22 DIAGNOSIS — Z1389 Encounter for screening for other disorder: Secondary | ICD-10-CM | POA: Diagnosis not present

## 2020-11-22 DIAGNOSIS — M85851 Other specified disorders of bone density and structure, right thigh: Secondary | ICD-10-CM | POA: Diagnosis not present

## 2020-11-22 DIAGNOSIS — M85852 Other specified disorders of bone density and structure, left thigh: Secondary | ICD-10-CM | POA: Diagnosis not present

## 2021-01-08 ENCOUNTER — Encounter: Payer: Self-pay | Admitting: Podiatry

## 2021-01-08 ENCOUNTER — Other Ambulatory Visit: Payer: Self-pay

## 2021-01-08 ENCOUNTER — Ambulatory Visit: Payer: Medicare Other | Admitting: Podiatry

## 2021-01-08 DIAGNOSIS — M47817 Spondylosis without myelopathy or radiculopathy, lumbosacral region: Secondary | ICD-10-CM | POA: Insufficient documentation

## 2021-01-08 DIAGNOSIS — I739 Peripheral vascular disease, unspecified: Secondary | ICD-10-CM | POA: Diagnosis not present

## 2021-01-08 DIAGNOSIS — M79675 Pain in left toe(s): Secondary | ICD-10-CM

## 2021-01-08 DIAGNOSIS — M79674 Pain in right toe(s): Secondary | ICD-10-CM

## 2021-01-08 DIAGNOSIS — E7800 Pure hypercholesterolemia, unspecified: Secondary | ICD-10-CM | POA: Insufficient documentation

## 2021-01-08 DIAGNOSIS — E039 Hypothyroidism, unspecified: Secondary | ICD-10-CM | POA: Insufficient documentation

## 2021-01-08 DIAGNOSIS — M47812 Spondylosis without myelopathy or radiculopathy, cervical region: Secondary | ICD-10-CM | POA: Insufficient documentation

## 2021-01-12 ENCOUNTER — Encounter: Payer: Self-pay | Admitting: Podiatry

## 2021-01-12 NOTE — Progress Notes (Signed)
Subjective:  Patient ID: Evelyn Hurst, female    DOB: June 24, 1935,  MRN: [id number]  Evelyn Hurst presents to clinic today for painful thick toenails that are difficult to trim. Pain interferes with ambulation. Aggravating factors include wearing enclosed shoe gear. Pain is relieved with periodic professional debridement.   She has LE microvascular disease confirmed by noninvasive vascular studies in December, 2020.  She has asked [place] to update her medication profile by removing Zofran, Norco, and Flomax from it as she is no longer taking them. She also states she had her tetanus vaccine on 11/22/2020.  Review of Systems: Negative except as noted in the HPI. Past Medical History:  Diagnosis Date  . Thyroid disease    Past Surgical History:  Procedure Laterality Date  . APPENDECTOMY    . CATARACT EXTRACTION BILATERAL W/ ANTERIOR VITRECTOMY    . TUBAL LIGATION      Current Outpatient Medications:  .  amoxicillin (AMOXIL) 500 MG capsule, Take 500 mg by mouth 3 (three) times daily., Disp: , Rfl:  .  B Complex Vitamins (VITAMIN B COMPLEX 100 IJ), one tablet, Disp: , Rfl:  .  Biotin 10 MG TABS, 1 tablet, Disp: , Rfl:  .  chlorhexidine (PERIDEX) 0.12 % solution, SMARTSIG:By Mouth, Disp: , Rfl:  .  Dextran 70-Hypromellose, PF, (TEARS NATURALE FREE) 0.1-0.3 % SOLN, , Disp: , Rfl:  .  diazepam (VALIUM) 5 MG tablet, one half tablet as needed, Disp: , Rfl:  .  folic acid (FOLVITE) 932 MCG tablet, one tablet, Disp: , Rfl:  .  Pediatric Multivitamins-Fl (MULTIVITAMINS/FL PO), one tablet, Disp: , Rfl:  .  SYNTHROID 50 MCG tablet, Take 50 mcg by mouth every Monday, Tuesday, Wednesday, Thursday, and Friday. , Disp: , Rfl:  .  SYNTHROID 75 MCG tablet, Take 75 mcg by mouth 2 (two) times a week. Saturday and Sunday, Disp: , Rfl:  .  Vitamins A & D (VITAMIN A & D) 10000-400 units TABS, 1 tab, Disp: , Rfl:    Allergies  Allergen Reactions  . Gold Sodium Thiosulfate Rash    erythematous patches  on the eyelids  . Ciprofloxacin     Other reaction(s): severe diarrhe  . Other     Gold sodium thiosulfate Palladium chloride  . Palladium Chloride     Other reaction(s): allergy test  . Silver Nitrate    Objective:   Constitutional Evelyn Hurst is a pleasant 86 y.o. Caucasian female, in NAD.  AAO x 3.   Vascular Capillary fill time to digits <3 seconds b/l lower extremities. Palpable pedal pulses b/l LE. Pedal hair sparse. Lower extremity skin temperature gradient within normal limits. No pain with calf compression b/l. No edema noted b/l lower extremities.  No cyanosis or clubbing noted.  Neurologic Normal speech. Oriented to person, place, and time. Epicritic sensation to light touch grossly present bilaterally. Protective sensation intact 5/5 intact bilaterally with 10g monofilament b/l. Vibratory sensation intact b/l.  Dermatologic Pedal skin with normal turgor, texture and tone bilaterally. No open wounds bilaterally. No interdigital macerations bilaterally. Toenails 1-5 b/l elongated, discolored, dystrophic, thickened, crumbly with subungual debris and tenderness to dorsal palpation.  Orthopedic: Normal muscle strength 5/5 to all lower extremity muscle groups bilaterally. No pain crepitus or joint limitation noted with ROM b/l. No gross bony deformities bilaterally.  Radiographs: None Assessment:   1. Pain in toes of both feet   2. PAD (peripheral artery disease) ([place])    Plan:  Patient was evaluated and treated  and all questions answered.  Onychomycosis with pain -Nails palliatively debridement as below -Educated on self-care  Procedure: Nail Debridement Rationale: Pain Type of Debridement: manual, sharp debridement. Instrumentation: Nail nipper, rotary burr. Number of Nails: 10 -Examined patient. She has h/o microvascular disease of digits confirmed by vascular studies in December, 2020. -No new findings. No new orders. -Patient to continue soft, supportive shoe gear  daily. -Toenails 1-5 b/l were debrided in length and girth with sterile nail nippers and dremel without iatrogenic bleeding.  -Patient to report any pedal injuries to medical professional immediately. -Patient/POA to call should there be question/concern in the interim.  Return in about 3 months (around 04/07/2021).  Marzetta Board, DPM

## 2021-02-24 DIAGNOSIS — L821 Other seborrheic keratosis: Secondary | ICD-10-CM | POA: Diagnosis not present

## 2021-02-24 DIAGNOSIS — L235 Allergic contact dermatitis due to other chemical products: Secondary | ICD-10-CM | POA: Diagnosis not present

## 2021-02-24 DIAGNOSIS — L812 Freckles: Secondary | ICD-10-CM | POA: Diagnosis not present

## 2021-02-24 DIAGNOSIS — L820 Inflamed seborrheic keratosis: Secondary | ICD-10-CM | POA: Diagnosis not present

## 2021-02-24 DIAGNOSIS — Z85828 Personal history of other malignant neoplasm of skin: Secondary | ICD-10-CM | POA: Diagnosis not present

## 2021-02-24 DIAGNOSIS — D1801 Hemangioma of skin and subcutaneous tissue: Secondary | ICD-10-CM | POA: Diagnosis not present

## 2021-04-23 ENCOUNTER — Other Ambulatory Visit: Payer: Self-pay

## 2021-04-23 ENCOUNTER — Encounter: Payer: Self-pay | Admitting: Podiatry

## 2021-04-23 ENCOUNTER — Ambulatory Visit: Payer: Medicare Other | Admitting: Podiatry

## 2021-04-23 DIAGNOSIS — M79675 Pain in left toe(s): Secondary | ICD-10-CM | POA: Diagnosis not present

## 2021-04-23 DIAGNOSIS — R7301 Impaired fasting glucose: Secondary | ICD-10-CM | POA: Insufficient documentation

## 2021-04-23 DIAGNOSIS — M79674 Pain in right toe(s): Secondary | ICD-10-CM

## 2021-04-29 NOTE — Progress Notes (Signed)
Subjective: Evelyn Hurst is a pleasant 86 y.o. female patient seen today painful thick toenails that are difficult to trim. Pain interferes with ambulation. Aggravating factors include wearing enclosed shoe gear. Pain is relieved with periodic professional debridement  Patient would like to discuss treatment options available for onychomycosis of toenails.  PCP is [name], MD. Last visit was: 11/22/2020.  Allergies  Allergen Reactions   Gold Sodium Thiosulfate Rash    erythematous patches on the eyelids   Ciprofloxacin     Other reaction(s): severe diarrhe   Other     Gold sodium thiosulfate Palladium chloride   Palladium Chloride     Other reaction(s): allergy test   Silver Nitrate     Objective: Physical Exam  General: Evelyn Hurst is a pleasant 86 y.o. Caucasian female, WD, WN in NAD. AAO x 3.   Vascular:  Capillary fill time to digits <3 seconds b/l lower extremities. Palpable pedal pulses b/l LE. Pedal hair sparse. Lower extremity skin temperature gradient within normal limits. No edema noted b/l lower extremities.  Dermatological:  Pedal skin with normal turgor, texture and tone bilaterally. No open wounds bilaterally. No interdigital macerations bilaterally. Toenails 1-5 b/l elongated, discolored, dystrophic, thickened, crumbly with subungual debris and tenderness to dorsal palpation.  Musculoskeletal:  Normal muscle strength 5/5 to all lower extremity muscle groups bilaterally. No pain crepitus or joint limitation noted with ROM b/l. No gross bony deformities bilaterally. Patient ambulates independent of any assistive aids.  Neurological:  Protective sensation intact 5/5 intact bilaterally with 10g monofilament b/l. Vibratory sensation intact b/l.  Assessment and Plan:  1. Pain in toes of both feet      -Examined patient. -Patient to continue soft, supportive shoe gear daily. -Discussed topical, laser and oral medication for onychomycosis. Patient  would like to think about laser therapy. Discussed cost and treatment. She may schedule treatment at her own convenience. -Toenails 1-5 b/l were debrided in length and girth with sterile nail nippers and dremel without iatrogenic bleeding.  -Patient to report any pedal injuries to medical professional immediately. -Patient/POA to call should there be question/concern in the interim.  Return in about 3 months (around 07/24/2021) for nail trim.  Marzetta Board, DPM

## 2021-07-08 DIAGNOSIS — H524 Presbyopia: Secondary | ICD-10-CM | POA: Diagnosis not present

## 2021-07-08 DIAGNOSIS — Z961 Presence of intraocular lens: Secondary | ICD-10-CM | POA: Diagnosis not present

## 2021-07-16 ENCOUNTER — Other Ambulatory Visit: Payer: Self-pay

## 2021-07-16 ENCOUNTER — Encounter: Payer: Self-pay | Admitting: Podiatry

## 2021-07-16 ENCOUNTER — Ambulatory Visit: Payer: Medicare Other | Admitting: Podiatry

## 2021-07-16 DIAGNOSIS — M79674 Pain in right toe(s): Secondary | ICD-10-CM

## 2021-07-16 DIAGNOSIS — M79675 Pain in left toe(s): Secondary | ICD-10-CM

## 2021-07-21 NOTE — Progress Notes (Signed)
Subjective: Evelyn Hurst is a 86 y.o. female patient seen today for follow up of  painful thick toenails that are difficult to trim. Pain interferes with ambulation. Aggravating factors include wearing enclosed shoe gear. Pain is relieved with periodic professional debridement.  She voices no new pedal problems on today's visit.  PCP is [name], MD. Last visit was: 11/22/2020.  New problems reported today: None.  Allergies  Allergen Reactions   Gold Sodium Thiosulfate Rash    erythematous patches on the eyelids   Ciprofloxacin     Other reaction(s): severe diarrhe   Gold     Other reaction(s): Unknown   Other     Gold sodium thiosulfate Palladium chloride   Palladium Chloride     Other reaction(s): allergy test Other reaction(s): Unknown   Silver Nitrate     Other reaction(s): allergy test    PCP is [name], MD .  Objective: Physical Exam  General: Patient is a pleasant 86 y.o. Caucasian female WD, WN in NAD. AAO x 3.   Neurovascular Examination: Capillary refill time to digits <3 seconds b/l. Palpable pedal pulses b/l LE. Pedal hair present. Lower extremity skin temperature gradient within normal limits. No edema noted b/l lower extremities. Digital hair sparse b/l.  Protective sensation intact 5/5 intact bilaterally with 10g monofilament b/l. Vibratory sensation intact b/l.  Dermatological:  Skin warm and supple b/l lower extremities. No open wounds b/l lower extremities. No interdigital macerations b/l lower extremities. Toenails 1-5 b/l elongated, discolored, dystrophic, thickened, crumbly with subungual debris and tenderness to dorsal palpation.  Musculoskeletal:  Normal muscle strength 5/5 to all lower extremity muscle groups bilaterally. No pain crepitus or joint limitation noted with ROM b/l lower extremities.  Gross deformities: None  Assessment: 1. Pain in toes of both feet    Plan: -No new findings. No new orders. -Patient to continue  soft, supportive shoe gear daily. -Toenails 1-5 b/l were debrided in length and girth with sterile nail nippers and dremel without iatrogenic bleeding.  -Patient to report any pedal injuries to medical professional immediately. -Patient/POA to call should there be question/concern in the interim.  Return in about 3 months (around 10/15/2021).  Marzetta Board, DPM

## 2021-07-24 DIAGNOSIS — E7800 Pure hypercholesterolemia, unspecified: Secondary | ICD-10-CM | POA: Diagnosis not present

## 2021-07-24 DIAGNOSIS — R7301 Impaired fasting glucose: Secondary | ICD-10-CM | POA: Diagnosis not present

## 2021-07-24 DIAGNOSIS — E039 Hypothyroidism, unspecified: Secondary | ICD-10-CM | POA: Diagnosis not present

## 2021-07-31 DIAGNOSIS — R7301 Impaired fasting glucose: Secondary | ICD-10-CM | POA: Diagnosis not present

## 2021-07-31 DIAGNOSIS — E039 Hypothyroidism, unspecified: Secondary | ICD-10-CM | POA: Diagnosis not present

## 2021-07-31 DIAGNOSIS — E7800 Pure hypercholesterolemia, unspecified: Secondary | ICD-10-CM | POA: Diagnosis not present

## 2021-08-27 DIAGNOSIS — L821 Other seborrheic keratosis: Secondary | ICD-10-CM | POA: Diagnosis not present

## 2021-08-27 DIAGNOSIS — Z85828 Personal history of other malignant neoplasm of skin: Secondary | ICD-10-CM | POA: Diagnosis not present

## 2021-08-27 DIAGNOSIS — L649 Androgenic alopecia, unspecified: Secondary | ICD-10-CM | POA: Diagnosis not present

## 2021-08-27 DIAGNOSIS — L812 Freckles: Secondary | ICD-10-CM | POA: Diagnosis not present

## 2021-08-27 DIAGNOSIS — D225 Melanocytic nevi of trunk: Secondary | ICD-10-CM | POA: Diagnosis not present

## 2021-08-27 DIAGNOSIS — L820 Inflamed seborrheic keratosis: Secondary | ICD-10-CM | POA: Diagnosis not present

## 2021-08-27 DIAGNOSIS — D1801 Hemangioma of skin and subcutaneous tissue: Secondary | ICD-10-CM | POA: Diagnosis not present

## 2021-09-11 DIAGNOSIS — J069 Acute upper respiratory infection, unspecified: Secondary | ICD-10-CM | POA: Diagnosis not present

## 2021-09-22 DIAGNOSIS — R0981 Nasal congestion: Secondary | ICD-10-CM | POA: Diagnosis not present

## 2021-09-22 DIAGNOSIS — R058 Other specified cough: Secondary | ICD-10-CM | POA: Diagnosis not present

## 2021-10-22 ENCOUNTER — Encounter: Payer: Self-pay | Admitting: Podiatrist

## 2021-10-22 ENCOUNTER — Ambulatory Visit: Payer: Medicare Other | Admitting: Podiatrist

## 2021-10-22 ENCOUNTER — Other Ambulatory Visit: Payer: Self-pay

## 2021-10-22 DIAGNOSIS — M79675 Pain in left toe(s): Secondary | ICD-10-CM | POA: Diagnosis not present

## 2021-10-22 DIAGNOSIS — M79674 Pain in right toe(s): Secondary | ICD-10-CM

## 2021-10-22 DIAGNOSIS — B351 Tinea unguium: Secondary | ICD-10-CM | POA: Diagnosis not present

## 2021-10-22 NOTE — Progress Notes (Signed)
Subjective: Evelyn Hurst is a 86 y.o. female patient seen today for follow up of  painful thick toenails that are difficult to trim. Pain interferes with ambulation. Aggravating factors include wearing enclosed shoe gear. Pain is relieved with periodic professional debridement. She relates she has tried multiple topical therapies for her fungal toenails and none have worked.  She is not interested in oral therapy.  She uses vicks vapo rub and states it helps keep the nails softer.    She voices no new pedal problems on today's visit.   PCP is Evelyn Orn, MD.   New problems reported today: None.  Objective: Physical Exam   General: Patient is a pleasant 86 y.o. Caucasian female WD, WN in NAD. AAO x 3.    Neurovascular Examination: Capillary refill time to digits <3 seconds b/l. Palpable pedal pulses b/l LE. Pedal hair present. Lower extremity skin temperature gradient within normal limits. No edema noted b/l lower extremities. Digital hair sparse b/l.   Protective sensation intact 5/5 intact bilaterally with 10g monofilament b/l. Vibratory sensation intact b/l.   Dermatological:  Skin warm and supple b/l lower extremities. No open wounds b/l lower extremities. No interdigital macerations b/l lower extremities. Toenails 1-5 b/l elongated, discolored, dystrophic, thickened, crumbly with subungual debris and tenderness to dorsal palpation.   Musculoskeletal:  Normal muscle strength 5/5 to all lower extremity muscle groups bilaterally. No pain crepitus or joint limitation noted with ROM b/l lower extremities.   Gross deformities: None   Assessment: 1. Pain in toes of both feet     Plan: -No new findings. No new orders. -Patient to continue soft, supportive shoe gear daily. -Toenails 1-5 b/l were debrided in length and girth with sterile nail nippers and dremel without iatrogenic bleeding.  -Patient to report any pedal injuries to medical professional immediately. -Patient/POA to  call should there be question/concern in the interim.

## 2021-11-27 DIAGNOSIS — M25552 Pain in left hip: Secondary | ICD-10-CM | POA: Diagnosis not present

## 2021-11-27 DIAGNOSIS — M85852 Other specified disorders of bone density and structure, left thigh: Secondary | ICD-10-CM | POA: Diagnosis not present

## 2021-11-27 DIAGNOSIS — E039 Hypothyroidism, unspecified: Secondary | ICD-10-CM | POA: Diagnosis not present

## 2021-11-27 DIAGNOSIS — Z1389 Encounter for screening for other disorder: Secondary | ICD-10-CM | POA: Diagnosis not present

## 2021-11-27 DIAGNOSIS — Z23 Encounter for immunization: Secondary | ICD-10-CM | POA: Diagnosis not present

## 2021-11-27 DIAGNOSIS — Z0000 Encounter for general adult medical examination without abnormal findings: Secondary | ICD-10-CM | POA: Diagnosis not present

## 2021-12-03 ENCOUNTER — Ambulatory Visit: Payer: Medicare Other | Admitting: Orthopedic Surgery

## 2021-12-03 ENCOUNTER — Ambulatory Visit: Payer: Self-pay

## 2021-12-03 ENCOUNTER — Other Ambulatory Visit: Payer: Self-pay

## 2021-12-03 DIAGNOSIS — M79605 Pain in left leg: Secondary | ICD-10-CM

## 2021-12-03 DIAGNOSIS — M25552 Pain in left hip: Secondary | ICD-10-CM | POA: Diagnosis not present

## 2021-12-07 ENCOUNTER — Encounter: Payer: Self-pay | Admitting: Orthopedic Surgery

## 2021-12-07 NOTE — Progress Notes (Signed)
Office Visit Note   Patient: Evelyn Hurst           Date of Birth: [birth year]           MRN: [id number] Visit Date: 12/03/2021 Requested by: Evelyn Orn, MD [address] [place],  [place] [address] PCP: Evelyn Orn, MD  Subjective: Chief Complaint  Patient presents with   Left Leg - Pain    HPI: Evelyn Hurst is an 87 year old patient with left leg pain.  Denies any numbness and tingling but the pain does radiate down the leg and includes the knee and thigh.  She has a history of low back pain and buttock pain.  Reports some mechanical symptoms in the left knee with occasional groin pain.  She states her back hurts all the time but it does not feel like her back is an issue.  She has been taking some over-the-counter medication with some relief.  Generally doing well until December.  Her enthusiasm for walking or exercising has been affected by the lateral sided knee pain.  There was a machine that she was using at the gym about a month ago which initiated the symptoms.              ROS: All systems reviewed are negative as they relate to the chief complaint within the history of present illness.  Patient denies  fevers or chills.   Assessment & Plan: Visit Diagnoses:  1. Pain in left leg     Plan: Impression is left leg pain with normal radiographs of the knee and mild arthritis in the left hip.  I think this could be radiating pain from her back or some type of iliotibial band bursitis.  Plan is over-the-counter Aleve 2 tablets by mouth twice a day for 10 days as well as physical therapy for low back stretching and strengthening.  I think iliotibial band stretching would also be helpful.  If that does not help we could consider injections into the knee and or trochanteric region and or back.  Follow-Up Instructions: No follow-ups on file.   Orders:  Orders Placed This Encounter  Procedures   XR HIP UNILAT W OR W/O PELVIS 2-3 VIEWS LEFT   XR KNEE 3 VIEW LEFT   No  orders of the defined types were placed in this encounter.     Procedures: No procedures performed   Clinical Data: No additional findings.  Objective: Vital Signs: There were no vitals taken for this visit.  Physical Exam:   Constitutional: Patient appears well-developed HEENT:  Head: Normocephalic Eyes:EOM are normal Neck: Normal range of motion Cardiovascular: Normal rate Pulmonary/chest: Effort normal Neurologic: Patient is alert Skin: Skin is warm Psychiatric: Patient has normal mood and affect   Ortho Exam: Ortho exam demonstrates normal gait alignment.  Palpable pedal pulses.  Good ankle dorsiflexion plantarflexion quad hamstring strength.  Collateral and cruciate ligaments are stable in both knees with no effusion.  Has a little bit of tenderness over the lateral condyle on the left knee compared to the right.  Mild trochanteric tenderness on the left compared to the right.  No groin pain with internal or external rotation of either leg.  No definite paresthesias L1 S1 bilaterally.  Reflexes symmetric 0 1+ out of 4 bilateral patella and Achilles.  Specialty Comments:  No specialty comments available.  Imaging: No results found.   PMFS History: Patient Active Problem List   Diagnosis Date Noted   Impaired fasting glucose 04/23/2021  Cervical spondylosis without myelopathy 01/08/2021   Hypercholesterolemia 01/08/2021   Hypothyroidism 01/08/2021   Lumbosacral spondylosis without myelopathy 01/08/2021   Allergic contact dermatitis 05/10/2012   Dermatitis 05/04/2012   Past Medical History:  Diagnosis Date   Thyroid disease     Family History  Problem Relation Age of Onset   Diabetes Mother    Heart disease Father     Past Surgical History:  Procedure Laterality Date   APPENDECTOMY     CATARACT EXTRACTION BILATERAL W/ ANTERIOR VITRECTOMY     TUBAL LIGATION     Social History   Occupational History   Occupation: semi retired Engineer, maintenance ([employer])  Tobacco Use    Smoking status: Former   Smokeless tobacco: Never  Substance and Sexual Activity   Alcohol use: Yes    Alcohol/week: 7.0 standard drinks    Types: 7 Standard drinks or equivalent per week    Comment: wine   Drug use: Not on file   Sexual activity: Not on file

## 2021-12-19 DIAGNOSIS — S76912D Strain of unspecified muscles, fascia and tendons at thigh level, left thigh, subsequent encounter: Secondary | ICD-10-CM | POA: Diagnosis not present

## 2021-12-19 DIAGNOSIS — M545 Low back pain, unspecified: Secondary | ICD-10-CM | POA: Diagnosis not present

## 2021-12-22 DIAGNOSIS — M545 Low back pain, unspecified: Secondary | ICD-10-CM | POA: Diagnosis not present

## 2021-12-22 DIAGNOSIS — S76912D Strain of unspecified muscles, fascia and tendons at thigh level, left thigh, subsequent encounter: Secondary | ICD-10-CM | POA: Diagnosis not present

## 2021-12-24 ENCOUNTER — Telehealth: Payer: Self-pay | Admitting: Orthopedic Surgery

## 2021-12-24 DIAGNOSIS — S76912D Strain of unspecified muscles, fascia and tendons at thigh level, left thigh, subsequent encounter: Secondary | ICD-10-CM | POA: Diagnosis not present

## 2021-12-24 DIAGNOSIS — M545 Low back pain, unspecified: Secondary | ICD-10-CM | POA: Diagnosis not present

## 2021-12-24 NOTE — Telephone Encounter (Signed)
Received vm fom Elisa w/ [facility] Dept. They have order from Dr. Marlou Sa and need xrays of the hip, asked for them to be emailed, [email or web address], ph [phone]. Xray pics emailed

## 2021-12-31 DIAGNOSIS — S76912D Strain of unspecified muscles, fascia and tendons at thigh level, left thigh, subsequent encounter: Secondary | ICD-10-CM | POA: Diagnosis not present

## 2021-12-31 DIAGNOSIS — M545 Low back pain, unspecified: Secondary | ICD-10-CM | POA: Diagnosis not present

## 2022-01-02 DIAGNOSIS — M545 Low back pain, unspecified: Secondary | ICD-10-CM | POA: Diagnosis not present

## 2022-01-02 DIAGNOSIS — S76912D Strain of unspecified muscles, fascia and tendons at thigh level, left thigh, subsequent encounter: Secondary | ICD-10-CM | POA: Diagnosis not present

## 2022-01-07 DIAGNOSIS — S76912D Strain of unspecified muscles, fascia and tendons at thigh level, left thigh, subsequent encounter: Secondary | ICD-10-CM | POA: Diagnosis not present

## 2022-01-07 DIAGNOSIS — M545 Low back pain, unspecified: Secondary | ICD-10-CM | POA: Diagnosis not present

## 2022-01-09 ENCOUNTER — Other Ambulatory Visit: Payer: Self-pay

## 2022-01-09 ENCOUNTER — Ambulatory Visit: Payer: Self-pay

## 2022-01-09 ENCOUNTER — Ambulatory Visit (INDEPENDENT_AMBULATORY_CARE_PROVIDER_SITE_OTHER): Payer: Medicare Other | Admitting: Surgical

## 2022-01-09 ENCOUNTER — Encounter: Payer: Self-pay | Admitting: Surgical

## 2022-01-09 DIAGNOSIS — M79605 Pain in left leg: Secondary | ICD-10-CM

## 2022-01-09 DIAGNOSIS — S76912D Strain of unspecified muscles, fascia and tendons at thigh level, left thigh, subsequent encounter: Secondary | ICD-10-CM | POA: Diagnosis not present

## 2022-01-09 DIAGNOSIS — M5410 Radiculopathy, site unspecified: Secondary | ICD-10-CM | POA: Diagnosis not present

## 2022-01-09 DIAGNOSIS — M25552 Pain in left hip: Secondary | ICD-10-CM | POA: Diagnosis not present

## 2022-01-09 DIAGNOSIS — M545 Low back pain, unspecified: Secondary | ICD-10-CM | POA: Diagnosis not present

## 2022-01-09 NOTE — Progress Notes (Signed)
Office Visit Note   Patient: Evelyn Hurst           Date of Birth: 1935/10/23           MRN: [id number] Visit Date: 01/09/2022 Requested by: [name], MD [address] [place],  [place] [address] PCP: [name], MD  Subjective: Chief Complaint  Patient presents with   Left Leg - Follow-up, Pain    HPI: Evelyn Hurst is a 87 y.o. female who presents to the office complaining of left leg pain.  Patient was last seen by Dr. Marlou Sa on 12/03/2021.  Since that visit she has been taking Aleve and then switch to Motrin.  She has been taking Motrin every day for about a month at this point.  She has also been going to physical therapy and feels like it was not really helpful until the last week where she feels like she has improved mildly.  She complains primarily of left buttock pain, left lateral hip pain with radiation down to the left knee along the lateral aspect of the thigh.  She occasionally has pain that radiates from her left buttock down to her left ankle.  She denies any right-sided symptoms, numbness/tingling.  She is able to lay on her left side and right side without difficulty.  She notes most of her pain is with going from a sitting to standing position.  She has no history of spine surgery or spine ESI's.  She does have a history of a herniated disc in the past but she has has had no recent MRI of the lumbar spine.  She feels like this all began after she did leg press for the second time at her gym and she noticed the next following day that she could not really walk and had increased leg pain..  Denies any bowel/bladder incontinence or saddle anesthesia              ROS: All systems reviewed are negative as they relate to the chief complaint within the history of present illness.  Patient denies fevers or chills.  Assessment & Plan: Visit Diagnoses: No diagnosis found.  Plan: Patient is a 87 year old female who presents for evaluation of left leg pain.  She  has been going to physical therapy with only mild relief of her symptoms.  She continues to complain primarily of lateral hip pain with left buttock pain that travels down to her left ankle at times.  Based on her history and exam, feel that she likely has greater trochanteric pain syndrome of the left leg based on her tenderness and pain that is increased with going from sitting to standing position.  Also feel that she has some component of left leg radicular pain from the lumbar spine given her extensive degenerative changes on radiographs taken today as well as the fact that she has radiating pain from her buttock to her left ankle and positive nerve root tension sign with straight leg raise on exam today.  After discussion of options, she would not like to try an injection and she would like to have MRI of the lumbar spine for further evaluation.  She will continue with physical therapy to focus on lumbar spine and trochanteric exercises.  Follow-up after MRI to review results.  Follow-Up Instructions: No follow-ups on file.   Orders:  No orders of the defined types were placed in this encounter.  No orders of the defined types were placed in this encounter.  Procedures: No procedures performed   Clinical Data: No additional findings.  Objective: Vital Signs: There were no vitals taken for this visit.  Physical Exam:  Constitutional: Patient appears well-developed HEENT:  Head: Normocephalic Eyes:EOM are normal Neck: Normal range of motion Cardiovascular: Normal rate Pulmonary/chest: Effort normal Neurologic: Patient is alert Skin: Skin is warm Psychiatric: Patient has normal mood and affect  Ortho Exam: Ortho exam demonstrates left knee without effusion.  Minimal tenderness over the medial lateral joint lines of the left knee.  Able to perform straight leg raise without difficulty.  5/5 motor strength of left hip flexion, quadricep, hamstring, dorsiflexion, plantarflexion,  EHL.  Sensation intact in all dermatomes of the bilateral lower extremities.  Positive straight leg raise of the left leg, negative on the right.  Tenderness throughout the axial lumbar spine.  Tenderness over the left greater trochanter moderately.  Increased pain with resisted hip AB duction and hip active internal rotation.  Specialty Comments:  No specialty comments available.  Imaging: No results found.   PMFS History: Patient Active Problem List   Diagnosis Date Noted   Impaired fasting glucose 04/23/2021   Cervical spondylosis without myelopathy 01/08/2021   Hypercholesterolemia 01/08/2021   Hypothyroidism 01/08/2021   Lumbosacral spondylosis without myelopathy 01/08/2021   Allergic contact dermatitis 05/10/2012   Dermatitis 05/04/2012   Past Medical History:  Diagnosis Date   Thyroid disease     Family History  Problem Relation Age of Onset   Diabetes Mother    Heart disease Father     Past Surgical History:  Procedure Laterality Date   APPENDECTOMY     CATARACT EXTRACTION BILATERAL W/ ANTERIOR VITRECTOMY     TUBAL LIGATION     Social History   Occupational History   Occupation: semi retired Engineer, maintenance ([employer])  Tobacco Use   Smoking status: Former   Smokeless tobacco: Never  Substance and Sexual Activity   Alcohol use: Yes    Alcohol/week: 7.0 standard drinks    Types: 7 Standard drinks or equivalent per week    Comment: wine   Drug use: Not on file   Sexual activity: Not on file

## 2022-01-12 ENCOUNTER — Telehealth: Payer: Self-pay | Admitting: Surgical

## 2022-01-12 DIAGNOSIS — S76912D Strain of unspecified muscles, fascia and tendons at thigh level, left thigh, subsequent encounter: Secondary | ICD-10-CM | POA: Diagnosis not present

## 2022-01-12 DIAGNOSIS — M545 Low back pain, unspecified: Secondary | ICD-10-CM | POA: Diagnosis not present

## 2022-01-12 NOTE — Telephone Encounter (Signed)
Patient had xrays taken Friday and wants to know what the results of those xrays were. Please advise

## 2022-01-12 NOTE — Telephone Encounter (Signed)
Can you advise? Patient had lumbar xrays on 01/09/2022.

## 2022-01-14 NOTE — Telephone Encounter (Signed)
noted 

## 2022-01-14 NOTE — Telephone Encounter (Signed)
Called and discussed

## 2022-01-15 DIAGNOSIS — M545 Low back pain, unspecified: Secondary | ICD-10-CM | POA: Diagnosis not present

## 2022-01-15 DIAGNOSIS — S76912D Strain of unspecified muscles, fascia and tendons at thigh level, left thigh, subsequent encounter: Secondary | ICD-10-CM | POA: Diagnosis not present

## 2022-01-20 DIAGNOSIS — S76912D Strain of unspecified muscles, fascia and tendons at thigh level, left thigh, subsequent encounter: Secondary | ICD-10-CM | POA: Diagnosis not present

## 2022-01-20 DIAGNOSIS — M545 Low back pain, unspecified: Secondary | ICD-10-CM | POA: Diagnosis not present

## 2022-01-25 ENCOUNTER — Ambulatory Visit
Admission: RE | Admit: 2022-01-25 | Discharge: 2022-01-25 | Disposition: A | Discharge status: Home / Self Care | Payer: Medicare Other | Source: Ambulatory Visit | Attending: Surgical | Admitting: Surgical

## 2022-01-25 DIAGNOSIS — M79605 Pain in left leg: Secondary | ICD-10-CM

## 2022-01-25 DIAGNOSIS — M4807 Spinal stenosis, lumbosacral region: Secondary | ICD-10-CM | POA: Diagnosis not present

## 2022-01-25 DIAGNOSIS — M48061 Spinal stenosis, lumbar region without neurogenic claudication: Secondary | ICD-10-CM | POA: Diagnosis not present

## 2022-01-25 DIAGNOSIS — M545 Low back pain, unspecified: Secondary | ICD-10-CM | POA: Diagnosis not present

## 2022-01-26 ENCOUNTER — Ambulatory Visit: Payer: Medicare Other | Admitting: Podiatry

## 2022-01-26 ENCOUNTER — Encounter: Payer: Self-pay | Admitting: Podiatry

## 2022-01-26 ENCOUNTER — Other Ambulatory Visit: Payer: Self-pay

## 2022-01-26 DIAGNOSIS — M79675 Pain in left toe(s): Secondary | ICD-10-CM

## 2022-01-26 DIAGNOSIS — B351 Tinea unguium: Secondary | ICD-10-CM | POA: Diagnosis not present

## 2022-01-26 DIAGNOSIS — M85852 Other specified disorders of bone density and structure, left thigh: Secondary | ICD-10-CM | POA: Insufficient documentation

## 2022-01-26 DIAGNOSIS — M79674 Pain in right toe(s): Secondary | ICD-10-CM

## 2022-01-27 DIAGNOSIS — M545 Low back pain, unspecified: Secondary | ICD-10-CM | POA: Diagnosis not present

## 2022-01-27 DIAGNOSIS — S76912D Strain of unspecified muscles, fascia and tendons at thigh level, left thigh, subsequent encounter: Secondary | ICD-10-CM | POA: Diagnosis not present

## 2022-01-30 ENCOUNTER — Ambulatory Visit: Payer: Medicare Other | Admitting: Surgical

## 2022-01-30 DIAGNOSIS — S76912D Strain of unspecified muscles, fascia and tendons at thigh level, left thigh, subsequent encounter: Secondary | ICD-10-CM | POA: Diagnosis not present

## 2022-01-30 DIAGNOSIS — M545 Low back pain, unspecified: Secondary | ICD-10-CM | POA: Diagnosis not present

## 2022-02-01 NOTE — Progress Notes (Signed)
?  Subjective:  ?Patient ID: Evelyn Hurst, female    DOB: 09-20-1935,  MRN: [id number] ? ?Evelyn Hurst presents to clinic today for painful elongated mycotic toenails 1-5 bilaterally which are tender when wearing enclosed shoe gear. Pain is relieved with periodic professional debridement. ? ?New problem(s): None.  ? ?PCP is Evelyn Orn, MD , and last visit was November 27, 2021. ? ?Allergies  ?Allergen Reactions  ? Gold Sodium Thiosulfate Rash  ?  erythematous patches on the eyelids  ? Ciprofloxacin   ?  Other reaction(s): severe diarrhe  ? Gold   ?  Other reaction(s): Unknown  ? Other   ?  Gold sodium thiosulfate ?Palladium chloride  ? Palladium Chloride   ?  Other reaction(s): allergy test ?Other reaction(s): Unknown  ? Silver Nitrate   ?  Other reaction(s): allergy test  ? ? ?Review of Systems: Negative except as noted in the HPI. ? ?Objective: No changes noted in today's physical examination. ?General: Patient is a pleasant 87 y.o. Caucasian female WD, WN in NAD. AAO x 3.  ? ?Neurovascular Examination: ?Capillary refill time to digits <3 seconds b/l. Palpable pedal pulses b/l LE. Pedal hair present. Lower extremity skin temperature gradient within normal limits. No edema noted b/l lower extremities. Digital hair sparse b/l. ? ?Protective sensation intact 5/5 intact bilaterally with 10g monofilament b/l. Vibratory sensation intact b/l. ? ?Dermatological:  ?Skin warm and supple b/l lower extremities. No open wounds b/l lower extremities. No interdigital macerations b/l lower extremities. Toenails 1-5 b/l elongated, discolored, dystrophic, thickened, crumbly with subungual debris and tenderness to dorsal palpation. ? ?Musculoskeletal:  ?Normal muscle strength 5/5 to all lower extremity muscle groups bilaterally. No pain crepitus or joint limitation noted with ROM b/l lower extremities. No gross deformities b/l. ? ?Assessment/Plan: ?1. Pain due to onychomycosis of toenails of both feet   ?  ?-Consent given for  treatment as described below: ?-Examined patient. ?-Mycotic toenails 1-5 bilaterally were debrided in length and girth with sterile nail nippers and dremel without incident. ?-Patient/POA to call should there be question/concern in the interim.  ? ?Return in about 3 months (around 04/28/2022). ? ?Marzetta Board, DPM  ?

## 2022-02-03 DIAGNOSIS — M545 Low back pain, unspecified: Secondary | ICD-10-CM | POA: Diagnosis not present

## 2022-02-03 DIAGNOSIS — S76912D Strain of unspecified muscles, fascia and tendons at thigh level, left thigh, subsequent encounter: Secondary | ICD-10-CM | POA: Diagnosis not present

## 2022-02-04 ENCOUNTER — Other Ambulatory Visit: Payer: Self-pay

## 2022-02-04 ENCOUNTER — Encounter: Payer: Self-pay | Admitting: Orthopedic Surgery

## 2022-02-04 ENCOUNTER — Ambulatory Visit (INDEPENDENT_AMBULATORY_CARE_PROVIDER_SITE_OTHER): Payer: Medicare Other | Admitting: Orthopedic Surgery

## 2022-02-04 DIAGNOSIS — M48061 Spinal stenosis, lumbar region without neurogenic claudication: Secondary | ICD-10-CM

## 2022-02-04 MED ORDER — DICLOFENAC SODIUM 50 MG PO TBEC: DELAYED_RELEASE_TABLET | ORAL | 0 refills | Status: DC

## 2022-02-04 NOTE — Progress Notes (Signed)
? ?Office Visit Note ?  ?Patient: Evelyn Hurst           ?Date of Birth: 06/10/1935           ?MRN: [id number] ?Visit Date: 02/04/2022 ?Requested by: [name], MD ?[address] ?[address] ?[place],  [place] [address] ?PCP: [name], MD ? ?Subjective: ?Chief Complaint  ?Patient presents with  ?? Other  ?   ?Scan review  ? ? ?HPI: Evelyn Hurst is a 87 y.o. female who presents to the office for MRI review. Continues to complain mainly of left leg pain that extends from her buttocks to her knee at times on the lateral aspect of the thigh.  She states that the radicular pain that travels all the way down to her ankle has improved.  She is still working with physical therapy and feels this has been helpful working with Evelyn Hurst.  She takes Motrin during the day, 2 in the morning and 2 at lunch.  Her overall goal is to return to playing golf and remains symptom-free.  She is currently putting and chipping but has not returned to full plane. ? ?MRI results revealed: ?MR Lumbar Spine w/o contrast ? ?Result Date: 01/26/2022 ?CLINICAL DATA:  Low back pain radiating to the left leg EXAM: MRI LUMBAR SPINE WITHOUT CONTRAST TECHNIQUE: Multiplanar, multisequence MR imaging of the lumbar spine was performed. No intravenous contrast was administered. COMPARISON:  11/20/2010 lumbar MRI report FINDINGS: Segmentation:  5 lumbar type vertebrae Alignment:  Dextroscoliosis and L4-5 anterolisthesis. Vertebrae:  Mild discogenic endplate edema at W1-0. Conus medullaris and cauda equina: Conus extends to the L1-2 level. Conus and cauda equina appear normal. Paraspinal and other soft tissues: Renal hilar cysts. Disc levels: T12- L1: Mild disc narrowing and bulging L1-L2: Mild disc narrowing and bulging.  Borderline facet spurring L2-L3: Disc collapse with disc bulging and endplate spurring eccentric to the left. Asymmetric left facet spurring. Left foraminal narrowing that is mild to moderate L3-L4: Disc collapse and  endplate degeneration with spurring eccentric to the left. Asymmetric left facet spurring. Left foraminal impingement. L4-L5: Facet degeneration with spurring on the right more than left. Mild anterolisthesis. Disc space narrowing and bulging eccentric to the right. Noncompressive right more than left foraminal narrowing. Mild spinal stenosis L5-S1:Facet and endplate spurring eccentric to the right. Mild-to-moderate right foraminal narrowing. IMPRESSION: 1. Generalized lumbar spine degeneration with scoliosis and L4-5 anterolisthesis. Mild discogenic endplate edema at U7-2. 2. On the symptomatic left side there is moderate foraminal narrowing at L3-4. Mild to moderate left foraminal narrowing at L2-3. 3. Mild to moderate right foraminal narrowing at L4-5 and L5-S1. 4. Diffusely patent spinal canal. Electronically Signed   By: Jorje Guild M.D.   On: 01/26/2022 08:21   ? ?             ?ROS: All systems reviewed are negative as they relate to the chief complaint within the history of present illness.  Patient denies fevers or chills. ? ?Assessment & Plan: ?Visit Diagnoses:  ?1. Neural foraminal stenosis of lumbar spine   ? ? ?Plan: Evelyn Hurst is a 87 y.o. female who presents to the office for review of MRI lumbar spine.  Discussed the MRI findings with the patient today that do demonstrate moderate foraminal narrowing at L3-L4 on the left which suspect is the source of most of her symptoms.  She may also have some contribution from trochanteric bursitis but seems the majority is likely from her back.  Discussed options available to patient including anti-inflammatory medication, physical therapy exercises, epidural steroid injections.  After long discussion with Evelyn Hurst, decided to try 3-week course of oral Voltaren to take once daily as needed.  Continue with physical therapy as this is helpful for her.  She will try to work back into returning to golf.  This is reasonable given how important it is to her.   If she flares her pain up from golfing or from other activities, then she will return to the office for L-spine ESI.  Patient agreed with plan.  Answered patient's questions to her satisfaction.  Follow-up with the office as needed. ? ?Follow-Up Instructions: No follow-ups on file.  ? ?Orders:  ?No orders of the defined types were placed in this encounter. ? ?Meds ordered this encounter  ?Medications  ?? diclofenac (VOLTAREN) 50 MG EC tablet  ?  Sig: 1 po q d to prn pain  ?  Dispense:  60 tablet  ?  Refill:  0  ? ? ? ? Procedures: ?No procedures performed ? ? ?Clinical Data: ?No additional findings. ? ?Objective: ?Vital Signs: There were no vitals taken for this visit. ? ?Physical Exam:  ?Constitutional: Patient appears well-developed ?HEENT:  ?Head: Normocephalic ?Eyes:EOM are normal ?Neck: Normal range of motion ?Cardiovascular: Normal rate ?Pulmonary/chest: Effort normal ?Neurologic: Patient is alert ?Skin: Skin is warm ?Psychiatric: Patient has normal mood and affect ? ?Ortho Exam: Ortho exam demonstrates 5/5 motor strength of hip flexion, quad, hamstring, dorsiflexion, plantarflexion.  No significant change from prior exam. ? ?Specialty Comments:  ?No specialty comments available. ? ?Imaging: ?No results found. ? ? ?PMFS History: ?Patient Active Problem List  ? Diagnosis Date Noted  ?? Other specified disorders of bone density and structure, left thigh 01/26/2022  ?? Impaired fasting glucose 04/23/2021  ?? Cervical spondylosis without myelopathy 01/08/2021  ?? Hypercholesterolemia 01/08/2021  ?? Hypothyroidism 01/08/2021  ?? Lumbosacral spondylosis without myelopathy 01/08/2021  ?? Allergic contact dermatitis 05/10/2012  ?? Dermatitis 05/04/2012  ? ?Past Medical History:  ?Diagnosis Date  ?? Thyroid disease   ?  ?Family History  ?Problem Relation Age of Onset  ?? Diabetes Mother   ?? Heart disease Father   ?  ?Past Surgical History:  ?Procedure Laterality Date  ?? APPENDECTOMY    ?? CATARACT EXTRACTION  BILATERAL W/ ANTERIOR VITRECTOMY    ?? TUBAL LIGATION    ? ?Social History  ? ?Occupational History  ?? Occupation: semi retired Engineer, maintenance (IT)  ?Tobacco Use  ?? Smoking status: Former  ?? Smokeless tobacco: Never  ?Substance and Sexual Activity  ?? Alcohol use: Yes  ?  Alcohol/week: 7.0 standard drinks  ?  Types: 7 Standard drinks or equivalent per week  ?  Comment: wine  ?? Drug use: Not on file  ?? Sexual activity: Not on file  ? ? ? ? ?   ?

## 2022-02-05 NOTE — Telephone Encounter (Signed)
Take as prescribed

## 2022-02-06 DIAGNOSIS — S76912D Strain of unspecified muscles, fascia and tendons at thigh level, left thigh, subsequent encounter: Secondary | ICD-10-CM | POA: Diagnosis not present

## 2022-02-06 DIAGNOSIS — M545 Low back pain, unspecified: Secondary | ICD-10-CM | POA: Diagnosis not present

## 2022-02-10 DIAGNOSIS — S76912D Strain of unspecified muscles, fascia and tendons at thigh level, left thigh, subsequent encounter: Secondary | ICD-10-CM | POA: Diagnosis not present

## 2022-02-10 DIAGNOSIS — M545 Low back pain, unspecified: Secondary | ICD-10-CM | POA: Diagnosis not present

## 2022-02-13 DIAGNOSIS — S76912D Strain of unspecified muscles, fascia and tendons at thigh level, left thigh, subsequent encounter: Secondary | ICD-10-CM | POA: Diagnosis not present

## 2022-02-13 DIAGNOSIS — M545 Low back pain, unspecified: Secondary | ICD-10-CM | POA: Diagnosis not present

## 2022-02-19 DIAGNOSIS — M545 Low back pain, unspecified: Secondary | ICD-10-CM | POA: Diagnosis not present

## 2022-02-19 DIAGNOSIS — S76912D Strain of unspecified muscles, fascia and tendons at thigh level, left thigh, subsequent encounter: Secondary | ICD-10-CM | POA: Diagnosis not present

## 2022-03-02 DIAGNOSIS — L821 Other seborrheic keratosis: Secondary | ICD-10-CM | POA: Diagnosis not present

## 2022-03-02 DIAGNOSIS — D2262 Melanocytic nevi of left upper limb, including shoulder: Secondary | ICD-10-CM | POA: Diagnosis not present

## 2022-03-02 DIAGNOSIS — L218 Other seborrheic dermatitis: Secondary | ICD-10-CM | POA: Diagnosis not present

## 2022-03-02 DIAGNOSIS — L820 Inflamed seborrheic keratosis: Secondary | ICD-10-CM | POA: Diagnosis not present

## 2022-03-02 DIAGNOSIS — Z85828 Personal history of other malignant neoplasm of skin: Secondary | ICD-10-CM | POA: Diagnosis not present

## 2022-03-02 DIAGNOSIS — D2261 Melanocytic nevi of right upper limb, including shoulder: Secondary | ICD-10-CM | POA: Diagnosis not present

## 2022-03-02 DIAGNOSIS — D225 Melanocytic nevi of trunk: Secondary | ICD-10-CM | POA: Diagnosis not present

## 2022-03-02 DIAGNOSIS — L814 Other melanin hyperpigmentation: Secondary | ICD-10-CM | POA: Diagnosis not present

## 2022-03-03 DIAGNOSIS — S76912D Strain of unspecified muscles, fascia and tendons at thigh level, left thigh, subsequent encounter: Secondary | ICD-10-CM | POA: Diagnosis not present

## 2022-03-03 DIAGNOSIS — M545 Low back pain, unspecified: Secondary | ICD-10-CM | POA: Diagnosis not present

## 2022-03-06 DIAGNOSIS — M545 Low back pain, unspecified: Secondary | ICD-10-CM | POA: Diagnosis not present

## 2022-03-06 DIAGNOSIS — S76912D Strain of unspecified muscles, fascia and tendons at thigh level, left thigh, subsequent encounter: Secondary | ICD-10-CM | POA: Diagnosis not present

## 2022-03-10 ENCOUNTER — Telehealth: Payer: Self-pay | Admitting: Orthopedic Surgery

## 2022-03-10 DIAGNOSIS — M545 Low back pain, unspecified: Secondary | ICD-10-CM | POA: Diagnosis not present

## 2022-03-10 DIAGNOSIS — S76912D Strain of unspecified muscles, fascia and tendons at thigh level, left thigh, subsequent encounter: Secondary | ICD-10-CM | POA: Diagnosis not present

## 2022-03-10 NOTE — Telephone Encounter (Signed)
Please sign the Plan of care for the pt to continue with Physical Therapy  ?

## 2022-03-11 NOTE — Telephone Encounter (Signed)
Will sign off and fax back once received.  ?

## 2022-03-12 ENCOUNTER — Telehealth: Payer: Self-pay | Admitting: Orthopedic Surgery

## 2022-03-12 DIAGNOSIS — S76912D Strain of unspecified muscles, fascia and tendons at thigh level, left thigh, subsequent encounter: Secondary | ICD-10-CM | POA: Diagnosis not present

## 2022-03-12 DIAGNOSIS — M545 Low back pain, unspecified: Secondary | ICD-10-CM | POA: Diagnosis not present

## 2022-03-12 NOTE — Telephone Encounter (Signed)
[facility] physical therapy and advised we haven't received either of these POC's and asked if could re-fax. Receptionist stated she was re-faxing them as we spoke both through the computer and manually. She faxed to [phone] ?

## 2022-03-12 NOTE — Telephone Encounter (Signed)
Patient called advised advised she can not continue her (PT) because there were two plan of  care forms that need to be signed and sent back to [facility]. One letter was dated 02/06/2022 and the other form was dated 03/06/2022. Patient said the forms needed to be faxed back to [facility]. The number to contact patient is [phone]   ?

## 2022-03-19 DIAGNOSIS — S76912D Strain of unspecified muscles, fascia and tendons at thigh level, left thigh, subsequent encounter: Secondary | ICD-10-CM | POA: Diagnosis not present

## 2022-03-19 DIAGNOSIS — M545 Low back pain, unspecified: Secondary | ICD-10-CM | POA: Diagnosis not present

## 2022-03-24 DIAGNOSIS — M545 Low back pain, unspecified: Secondary | ICD-10-CM | POA: Diagnosis not present

## 2022-03-24 DIAGNOSIS — S76912D Strain of unspecified muscles, fascia and tendons at thigh level, left thigh, subsequent encounter: Secondary | ICD-10-CM | POA: Diagnosis not present

## 2022-03-27 DIAGNOSIS — S76912D Strain of unspecified muscles, fascia and tendons at thigh level, left thigh, subsequent encounter: Secondary | ICD-10-CM | POA: Diagnosis not present

## 2022-03-27 DIAGNOSIS — M545 Low back pain, unspecified: Secondary | ICD-10-CM | POA: Diagnosis not present

## 2022-04-02 DIAGNOSIS — S76912D Strain of unspecified muscles, fascia and tendons at thigh level, left thigh, subsequent encounter: Secondary | ICD-10-CM | POA: Diagnosis not present

## 2022-04-02 DIAGNOSIS — M545 Low back pain, unspecified: Secondary | ICD-10-CM | POA: Diagnosis not present

## 2022-04-07 DIAGNOSIS — S76912D Strain of unspecified muscles, fascia and tendons at thigh level, left thigh, subsequent encounter: Secondary | ICD-10-CM | POA: Diagnosis not present

## 2022-04-07 DIAGNOSIS — M545 Low back pain, unspecified: Secondary | ICD-10-CM | POA: Diagnosis not present

## 2022-04-09 DIAGNOSIS — S76912D Strain of unspecified muscles, fascia and tendons at thigh level, left thigh, subsequent encounter: Secondary | ICD-10-CM | POA: Diagnosis not present

## 2022-04-09 DIAGNOSIS — M545 Low back pain, unspecified: Secondary | ICD-10-CM | POA: Diagnosis not present

## 2022-04-14 DIAGNOSIS — M545 Low back pain, unspecified: Secondary | ICD-10-CM | POA: Diagnosis not present

## 2022-04-14 DIAGNOSIS — S76912D Strain of unspecified muscles, fascia and tendons at thigh level, left thigh, subsequent encounter: Secondary | ICD-10-CM | POA: Diagnosis not present

## 2022-04-16 DIAGNOSIS — L905 Scar conditions and fibrosis of skin: Secondary | ICD-10-CM | POA: Diagnosis not present

## 2022-04-16 DIAGNOSIS — Z85828 Personal history of other malignant neoplasm of skin: Secondary | ICD-10-CM | POA: Diagnosis not present

## 2022-04-17 DIAGNOSIS — M545 Low back pain, unspecified: Secondary | ICD-10-CM | POA: Diagnosis not present

## 2022-04-17 DIAGNOSIS — S76912D Strain of unspecified muscles, fascia and tendons at thigh level, left thigh, subsequent encounter: Secondary | ICD-10-CM | POA: Diagnosis not present

## 2022-04-24 DIAGNOSIS — M545 Low back pain, unspecified: Secondary | ICD-10-CM | POA: Diagnosis not present

## 2022-04-24 DIAGNOSIS — S76912D Strain of unspecified muscles, fascia and tendons at thigh level, left thigh, subsequent encounter: Secondary | ICD-10-CM | POA: Diagnosis not present

## 2022-04-29 ENCOUNTER — Ambulatory Visit: Payer: Medicare Other | Admitting: Podiatry

## 2022-04-29 ENCOUNTER — Encounter: Payer: Self-pay | Admitting: Podiatry

## 2022-04-29 DIAGNOSIS — M79675 Pain in left toe(s): Secondary | ICD-10-CM

## 2022-04-29 DIAGNOSIS — M79674 Pain in right toe(s): Secondary | ICD-10-CM

## 2022-04-29 DIAGNOSIS — B351 Tinea unguium: Secondary | ICD-10-CM | POA: Diagnosis not present

## 2022-05-04 NOTE — Progress Notes (Signed)
  Subjective:  Patient ID: Evelyn Hurst, female    DOB: 22-Jun-1935,  MRN: [id number]  Evelyn Hurst presents to clinic today for painful elongated mycotic toenails 1-5 bilaterally which are tender when wearing enclosed shoe gear. Pain is relieved with periodic professional debridement.  New problem(s): None.   PCP is [name], MD , and last visit was November 27, 2021.  Allergies  Allergen Reactions   Gold Sodium Thiosulfate Rash    erythematous patches on the eyelids   Ciprofloxacin     Other reaction(s): severe diarrhe   Gold     Other reaction(s): Unknown   Other     Gold sodium thiosulfate Palladium chloride   Palladium Chloride     Other reaction(s): allergy test Other reaction(s): Unknown   Silver Nitrate     Other reaction(s): allergy test    Review of Systems: Negative except as noted in the HPI.  Objective: No changes noted in today's physical examination. General: Patient is a pleasant 87 y.o. Caucasian female WD, WN in NAD. AAO x 3.   Neurovascular Examination: Capillary refill time to digits <3 seconds b/l. Palpable pedal pulses b/l LE. Pedal hair present. Lower extremity skin temperature gradient within normal limits. No edema noted b/l lower extremities. Digital hair sparse b/l.  Protective sensation intact 5/5 intact bilaterally with 10g monofilament b/l. Vibratory sensation intact b/l.  Dermatological:  Skin warm and supple b/l lower extremities. No open wounds b/l lower extremities. No interdigital macerations b/l lower extremities. Toenails 1-5 b/l elongated, discolored, dystrophic, thickened, crumbly with subungual debris and tenderness to dorsal palpation.  Musculoskeletal:  Normal muscle strength 5/5 to all lower extremity muscle groups bilaterally. No pain crepitus or joint limitation noted with ROM b/l lower extremities. No gross deformities b/l.  Assessment/Plan: 1. Pain due to onychomycosis of toenails of both feet     -Patient was  evaluated and treated. All patient's and/or POA's questions/concerns answered on today's visit. -Examined patient. -Patient to continue soft, supportive shoe gear daily. -Mycotic toenails 1-5 bilaterally were debrided in length and girth with sterile nail nippers and dremel without incident. -Patient/POA to call should there be question/concern in the interim.   Return in about 3 months (around 07/30/2022).  Marzetta Board, DPM

## 2022-05-28 IMAGING — MR MR LUMBAR SPINE W/O CM
4 of 5 series · 28 of 48 positions shown · non-contrast
Comparison: 11/20/2010 lumbar MRI report

CLINICAL DATA: Low back pain radiating to the left leg

EXAM:
MRI LUMBAR SPINE WITHOUT CONTRAST
TECHNIQUE: Multiplanar, multisequence MR imaging of the lumbar spine was
performed. No intravenous contrast was administered.

[Series 3: T2 · sagittal · 4.0mm · 1.09mm/px · 7 of 17 slices shown (1 of 2)]
[im 1/17]
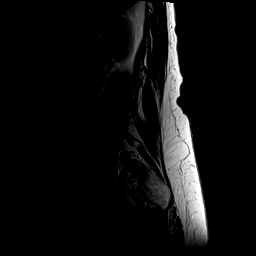
[im 3/17]
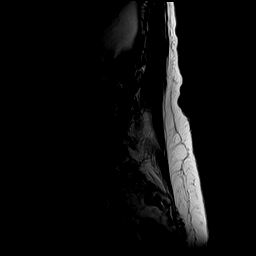
[im 6/17]
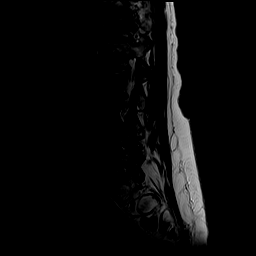
[im 9/17]
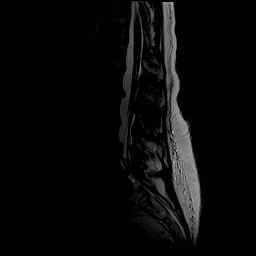
[im 11/17]
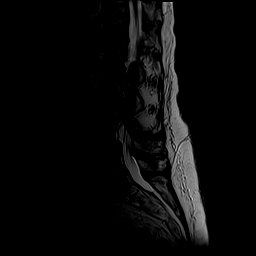
[im 14/17]
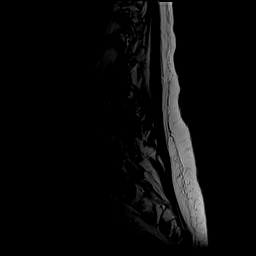
[im 17/17]
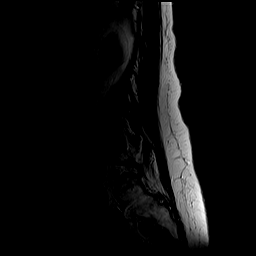

[Series 5: T1 · sagittal · 4.0mm · 1.09mm/px · 6 of 17 slices shown (1 of 2)]
[im 1/17]
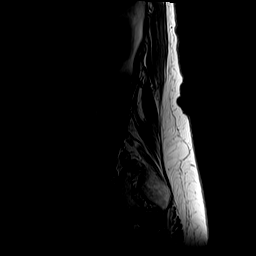
[im 4/17]
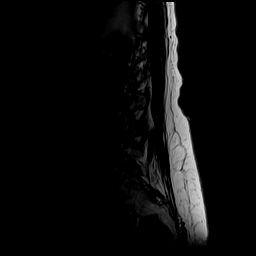
[im 7/17]
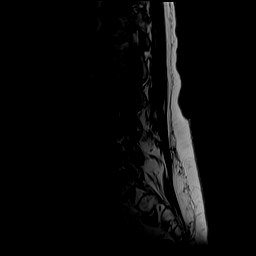
[im 10/17]
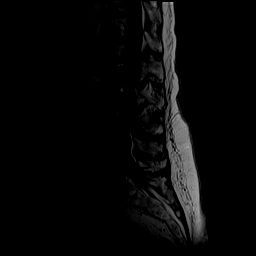
[im 13/17]
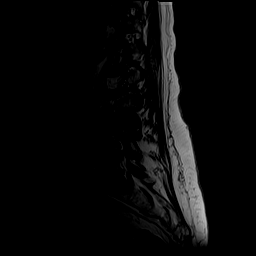
[im 17/17]
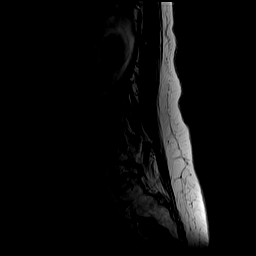

[Series 6: T2 · axial · 4.0mm · 0.39mm/px · z∈[-23,+169]mm · 8 of 38 slices shown (2 of 2)]
[im 1/38]
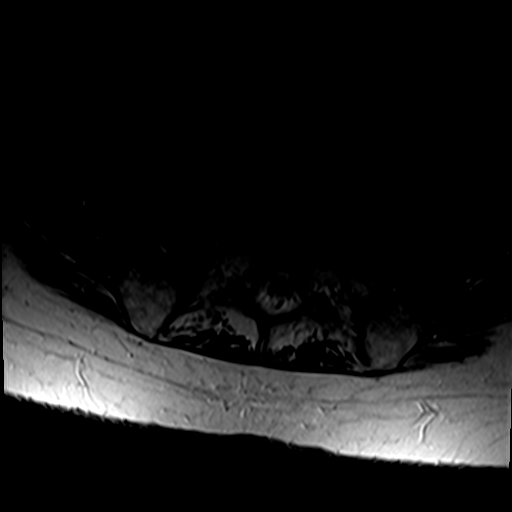
[im 6/38]
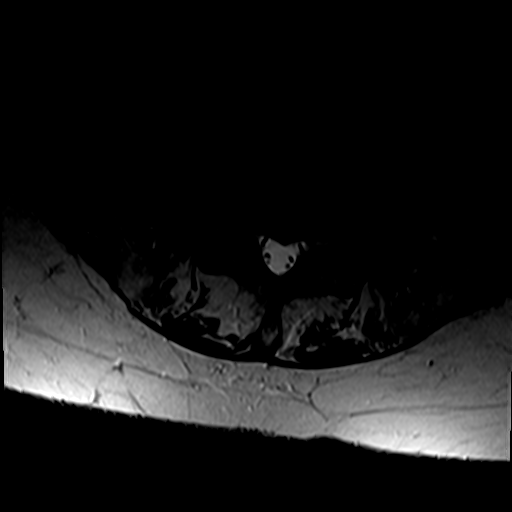
[im 12/38]
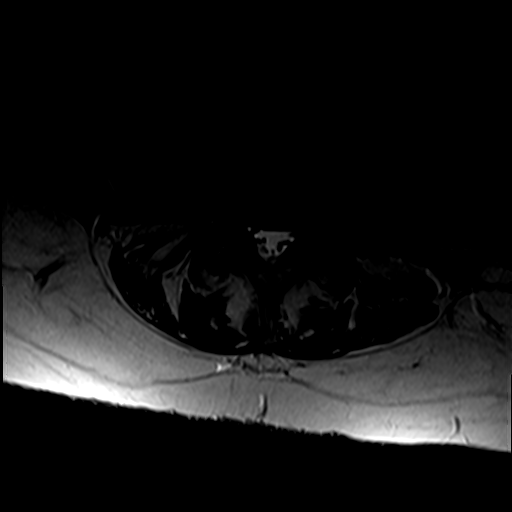
[im 18/38]
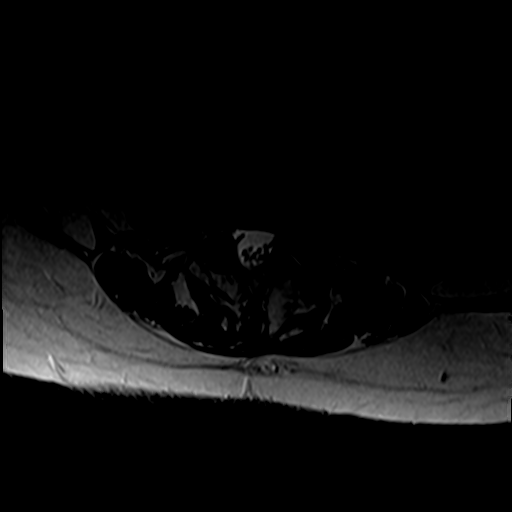
[im 20/38]
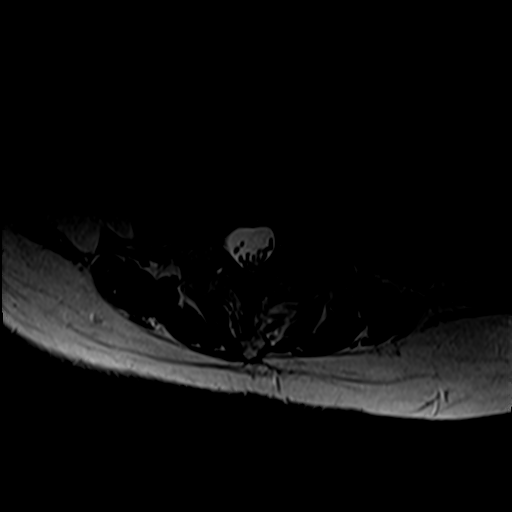
[im 26/38]
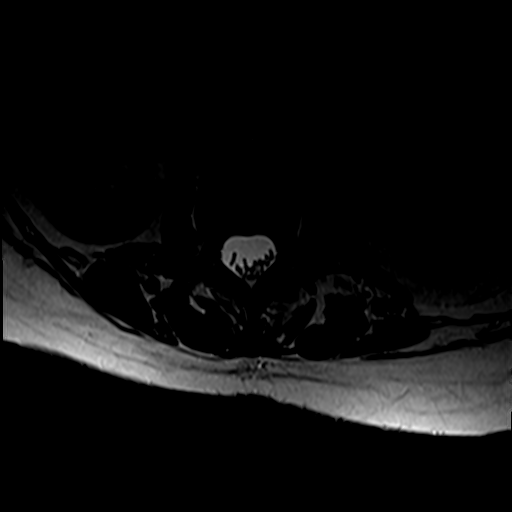
[im 32/38]
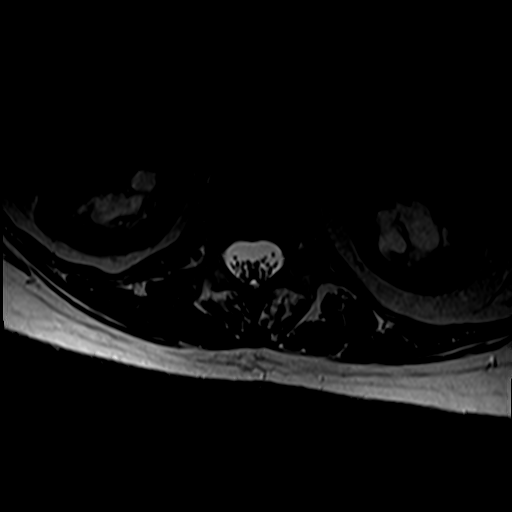
[im 38/38]
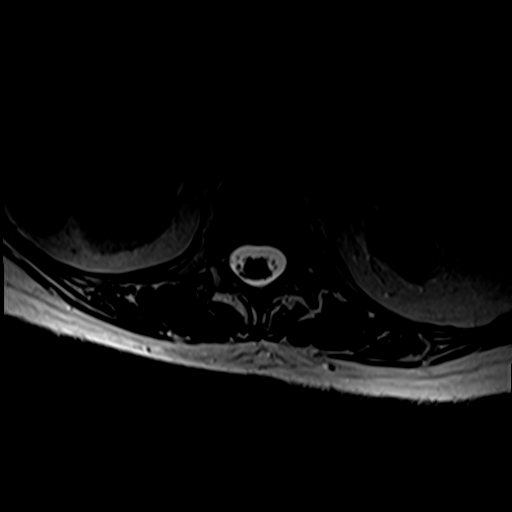

[Series 7: T1 · axial · 4.0mm · 0.39mm/px · z∈[-23,+140]mm · 7 of 38 slices shown (2 of 2)]
[im 1/38]
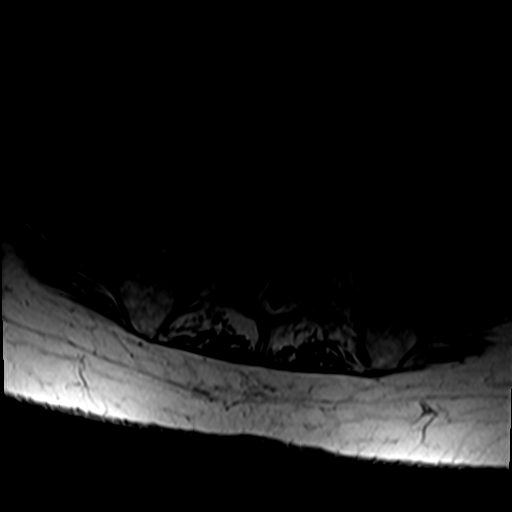
[im 6/38]
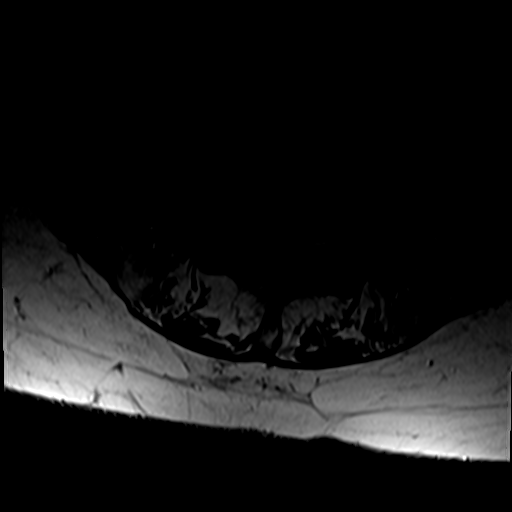
[im 12/38]
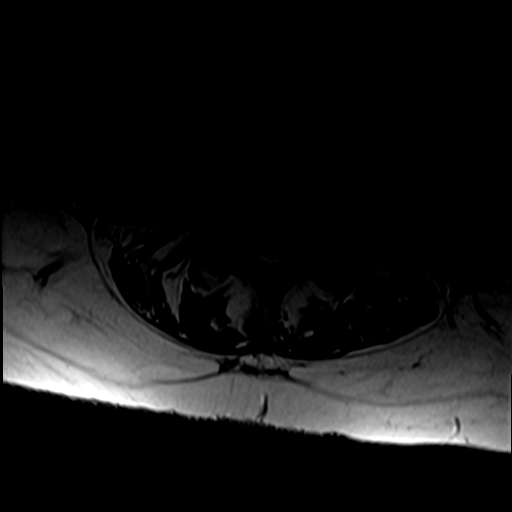
[im 18/38]
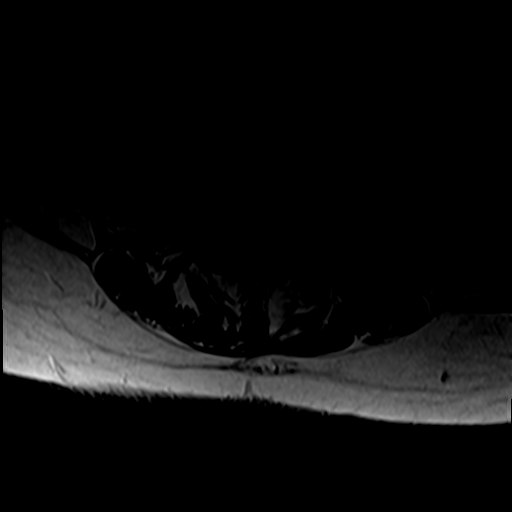
[im 20/38]
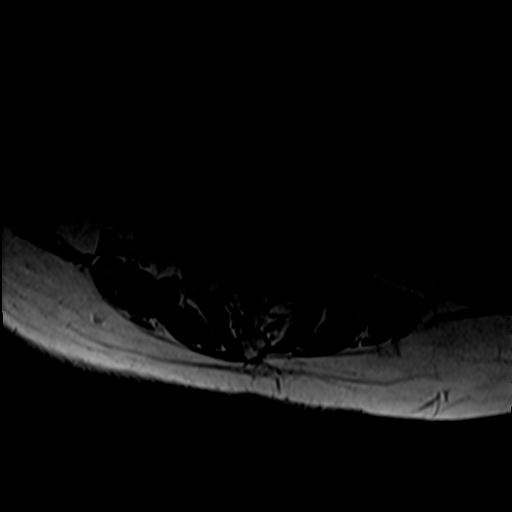
[im 26/38]
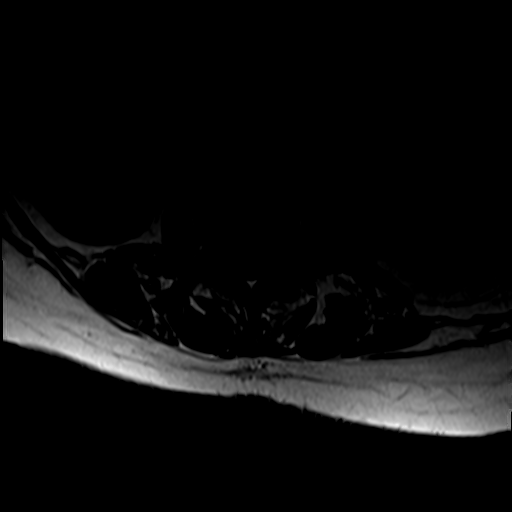
[im 32/38]
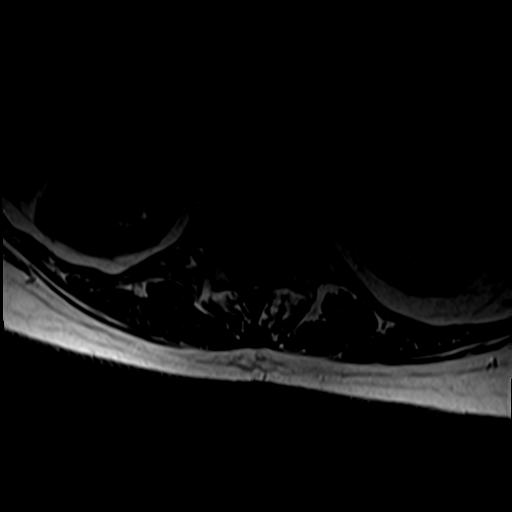

[28 of 48 positions shown; findings below may reference images not displayed]

FINDINGS: Segmentation:  5 lumbar type vertebrae

Alignment:  Dextroscoliosis and L4-5 anterolisthesis.

Vertebrae:  Mild discogenic endplate edema at L3-4.

Conus medullaris and cauda equina: Conus extends to the L1-2 level.
Conus and cauda equina appear normal.

Paraspinal and other soft tissues: Renal hilar cysts.

Disc levels:

T12- L1: Mild disc narrowing and bulging

L1-L2: Mild disc narrowing and bulging.  Borderline facet spurring

L2-L3: Disc collapse with disc bulging and endplate spurring
eccentric to the left. Asymmetric left facet spurring. Left
foraminal narrowing that is mild to moderate

L3-L4: Disc collapse and endplate degeneration with spurring
eccentric to the left. Asymmetric left facet spurring. Left
foraminal impingement.

L4-L5: Facet degeneration with spurring on the right more than left.
Mild anterolisthesis. Disc space narrowing and bulging eccentric to
the right. Noncompressive right more than left foraminal narrowing.
Mild spinal stenosis

L5-S1:Facet and endplate spurring eccentric to the right.
Mild-to-moderate right foraminal narrowing.
IMPRESSION: 1. Generalized lumbar spine degeneration with scoliosis and L4-5
anterolisthesis. Mild discogenic endplate edema at L3-4.
2. On the symptomatic left side there is moderate foraminal
narrowing at L3-4. Mild to moderate left foraminal narrowing at
L2-3.
3. Mild to moderate right foraminal narrowing at L4-5 and L5-S1.
4. Diffusely patent spinal canal.

## 2022-07-10 DIAGNOSIS — Z961 Presence of intraocular lens: Secondary | ICD-10-CM | POA: Diagnosis not present

## 2022-07-10 DIAGNOSIS — H52203 Unspecified astigmatism, bilateral: Secondary | ICD-10-CM | POA: Diagnosis not present

## 2022-07-27 DIAGNOSIS — E039 Hypothyroidism, unspecified: Secondary | ICD-10-CM | POA: Diagnosis not present

## 2022-07-27 DIAGNOSIS — E7800 Pure hypercholesterolemia, unspecified: Secondary | ICD-10-CM | POA: Diagnosis not present

## 2022-07-27 DIAGNOSIS — R7301 Impaired fasting glucose: Secondary | ICD-10-CM | POA: Diagnosis not present

## 2022-08-03 DIAGNOSIS — R7301 Impaired fasting glucose: Secondary | ICD-10-CM | POA: Diagnosis not present

## 2022-08-03 DIAGNOSIS — E039 Hypothyroidism, unspecified: Secondary | ICD-10-CM | POA: Diagnosis not present

## 2022-08-03 DIAGNOSIS — E7800 Pure hypercholesterolemia, unspecified: Secondary | ICD-10-CM | POA: Diagnosis not present

## 2022-08-12 ENCOUNTER — Encounter: Payer: Self-pay | Admitting: Podiatry

## 2022-08-12 ENCOUNTER — Ambulatory Visit: Payer: Medicare Other | Admitting: Podiatry

## 2022-08-12 DIAGNOSIS — B351 Tinea unguium: Secondary | ICD-10-CM

## 2022-08-12 DIAGNOSIS — M79675 Pain in left toe(s): Secondary | ICD-10-CM | POA: Diagnosis not present

## 2022-08-12 DIAGNOSIS — M79674 Pain in right toe(s): Secondary | ICD-10-CM | POA: Diagnosis not present

## 2022-08-15 NOTE — Progress Notes (Signed)
  Subjective:  Patient ID: Evelyn Hurst, female    DOB: 19-Mar-1935,  MRN: [id number]  Evelyn Hurst presents to clinic today for:  Chief Complaint  Patient presents with   routine foot care     New problem(s): None.   PCP is [name], MD , and last visit was  November 27, 2021.  Allergies  Allergen Reactions   Gold Sodium Thiosulfate Rash    erythematous patches on the eyelids   Ciprofloxacin     Other reaction(s): severe diarrhe   Gold     Other reaction(s): Unknown   Other     Gold sodium thiosulfate Palladium chloride   Palladium Chloride     Other reaction(s): allergy test Other reaction(s): Unknown   Silver Nitrate     Other reaction(s): allergy test    Review of Systems: Negative except as noted in the HPI.  Objective: No changes noted in today's physical examination.  Evelyn Hurst is a pleasant 87 y.o. female WD, WN in NAD. AAO x 3. Neurovascular Examination: Capillary refill time to digits <3 seconds b/l. Palpable pedal pulses b/l LE. Pedal hair present. Lower extremity skin temperature gradient within normal limits. No edema noted b/l lower extremities. Digital hair sparse b/l.  Protective sensation intact 5/5 intact bilaterally with 10g monofilament b/l. Vibratory sensation intact b/l.  Dermatological:  Skin warm and supple b/l lower extremities. No open wounds b/l lower extremities. No interdigital macerations b/l lower extremities. Toenails 1-5 b/l elongated, discolored, dystrophic, thickened, crumbly with subungual debris and tenderness to dorsal palpation.  Musculoskeletal:  Normal muscle strength 5/5 to all lower extremity muscle groups bilaterally. No pain crepitus or joint limitation noted with ROM b/l lower extremities. No gross deformities b/l.  Assessment/Plan: 1. Pain due to onychomycosis of toenails of both feet     No orders of the defined types were placed in this encounter.   -Consent given for treatment as described  below: -Examined patient. -Patient to continue soft, supportive shoe gear daily. -Mycotic toenails 1-5 bilaterally were debrided in length and girth with sterile nail nippers and dremel without incident. -Patient/POA to call should there be question/concern in the interim.   Return in about 3 months (around 11/11/2022).  Marzetta Board, DPM

## 2022-09-01 DIAGNOSIS — D1801 Hemangioma of skin and subcutaneous tissue: Secondary | ICD-10-CM | POA: Diagnosis not present

## 2022-09-01 DIAGNOSIS — L718 Other rosacea: Secondary | ICD-10-CM | POA: Diagnosis not present

## 2022-09-01 DIAGNOSIS — L812 Freckles: Secondary | ICD-10-CM | POA: Diagnosis not present

## 2022-09-01 DIAGNOSIS — Z85828 Personal history of other malignant neoplasm of skin: Secondary | ICD-10-CM | POA: Diagnosis not present

## 2022-09-01 DIAGNOSIS — L821 Other seborrheic keratosis: Secondary | ICD-10-CM | POA: Diagnosis not present

## 2022-11-19 DIAGNOSIS — Z85828 Personal history of other malignant neoplasm of skin: Secondary | ICD-10-CM | POA: Diagnosis not present

## 2022-11-19 DIAGNOSIS — L718 Other rosacea: Secondary | ICD-10-CM | POA: Diagnosis not present

## 2022-11-19 DIAGNOSIS — L218 Other seborrheic dermatitis: Secondary | ICD-10-CM | POA: Diagnosis not present

## 2022-12-01 ENCOUNTER — Ambulatory Visit: Payer: Medicare Other | Admitting: Podiatry

## 2022-12-01 ENCOUNTER — Encounter: Payer: Self-pay | Admitting: Podiatry

## 2022-12-01 VITALS — BP 120/71

## 2022-12-01 DIAGNOSIS — M79674 Pain in right toe(s): Secondary | ICD-10-CM | POA: Diagnosis not present

## 2022-12-01 DIAGNOSIS — I739 Peripheral vascular disease, unspecified: Secondary | ICD-10-CM | POA: Diagnosis not present

## 2022-12-01 DIAGNOSIS — B351 Tinea unguium: Secondary | ICD-10-CM | POA: Diagnosis not present

## 2022-12-01 DIAGNOSIS — M79675 Pain in left toe(s): Secondary | ICD-10-CM | POA: Diagnosis not present

## 2022-12-01 NOTE — Progress Notes (Signed)
  Subjective:  Patient ID: Evelyn Hurst, female    DOB: 09/04/1935,  MRN: [id number]  Evelyn Hurst presents to clinic today for at risk foot care. Patient has h/o PAD and painful elongated mycotic toenails 1-5 bilaterally which are tender when wearing enclosed shoe gear. Pain is relieved with periodic professional debridement.  Chief Complaint  Patient presents with   Nail Problem    [facility] PCP-Raju,[facility] PCP VST-Fall 2023   New problem(s): None.   PCP is Charlane Ferretti, MD.  Allergies  Allergen Reactions   Gold Sodium Thiosulfate Rash    erythematous patches on the eyelids   Ciprofloxacin     Other reaction(s): severe diarrhe   Gold     Other reaction(s): Unknown   Other     Gold sodium thiosulfate Palladium chloride   Palladium Chloride     Other reaction(s): allergy test Other reaction(s): Unknown   Silver Nitrate     Other reaction(s): allergy test    Review of Systems: Negative except as noted in the HPI.  Objective: No changes noted in today's physical examination. Vitals:   12/01/22 1619  BP: 120/71   Evelyn Hurst is a pleasant 88 y.o. female WD, WN in NAD. AAO x 3.  Neurovascular Examination: Capillary refill time to digits <3 seconds b/l. Palpable pedal pulses b/l LE. Pedal hair present. Lower extremity skin temperature gradient within normal limits. No edema noted b/l lower extremities. Digital hair sparse b/l.  Protective sensation intact 5/5 intact bilaterally with 10g monofilament b/l. Vibratory sensation intact b/l.  Dermatological:  Skin warm and supple b/l lower extremities. No open wounds b/l lower extremities. No interdigital macerations b/l lower extremities.   Toenails 1-5 b/l elongated, discolored, dystrophic, thickened, crumbly with subungual debris and tenderness to dorsal palpation.  Musculoskeletal:  Normal muscle strength 5/5 to all lower extremity muscle groups bilaterally. No pain crepitus or joint limitation noted with ROM b/l  lower extremities. No gross deformities b/l.  Assessment/Plan: 1. Pain due to onychomycosis of toenails of both feet   2. PAD (peripheral artery disease) ([place])     -Patient was evaluated and treated. All patient's and/or POA's questions/concerns answered on today's visit. -Continue supportive shoe gear daily. -Toenails 1-5 b/l were debrided in length and girth with sterile nail nippers and dremel without iatrogenic bleeding.  -Patient/POA to call should there be question/concern in the interim.   No follow-ups on file.  Marzetta Board, DPM

## 2022-12-22 ENCOUNTER — Other Ambulatory Visit: Payer: Self-pay | Admitting: Internal Medicine

## 2022-12-22 DIAGNOSIS — E7800 Pure hypercholesterolemia, unspecified: Secondary | ICD-10-CM | POA: Diagnosis not present

## 2022-12-22 DIAGNOSIS — Z23 Encounter for immunization: Secondary | ICD-10-CM | POA: Diagnosis not present

## 2022-12-22 DIAGNOSIS — E039 Hypothyroidism, unspecified: Secondary | ICD-10-CM | POA: Diagnosis not present

## 2022-12-22 DIAGNOSIS — R7303 Prediabetes: Secondary | ICD-10-CM | POA: Diagnosis not present

## 2022-12-22 DIAGNOSIS — M810 Age-related osteoporosis without current pathological fracture: Secondary | ICD-10-CM | POA: Diagnosis not present

## 2022-12-22 DIAGNOSIS — M8589 Other specified disorders of bone density and structure, multiple sites: Secondary | ICD-10-CM

## 2022-12-22 DIAGNOSIS — I6523 Occlusion and stenosis of bilateral carotid arteries: Secondary | ICD-10-CM | POA: Diagnosis not present

## 2022-12-22 DIAGNOSIS — Z0000 Encounter for general adult medical examination without abnormal findings: Secondary | ICD-10-CM | POA: Diagnosis not present

## 2022-12-23 DIAGNOSIS — R7303 Prediabetes: Secondary | ICD-10-CM | POA: Diagnosis not present

## 2022-12-23 DIAGNOSIS — E7800 Pure hypercholesterolemia, unspecified: Secondary | ICD-10-CM | POA: Diagnosis not present

## 2022-12-25 DIAGNOSIS — I6523 Occlusion and stenosis of bilateral carotid arteries: Secondary | ICD-10-CM | POA: Diagnosis not present

## 2023-01-28 ENCOUNTER — Other Ambulatory Visit: Payer: Medicare Other

## 2023-03-16 ENCOUNTER — Encounter: Payer: Self-pay | Admitting: Podiatry

## 2023-03-16 ENCOUNTER — Ambulatory Visit: Payer: Medicare Other | Admitting: Podiatry

## 2023-03-16 DIAGNOSIS — I739 Peripheral vascular disease, unspecified: Secondary | ICD-10-CM

## 2023-03-16 DIAGNOSIS — B351 Tinea unguium: Secondary | ICD-10-CM

## 2023-03-16 DIAGNOSIS — M79675 Pain in left toe(s): Secondary | ICD-10-CM

## 2023-03-16 DIAGNOSIS — M79674 Pain in right toe(s): Secondary | ICD-10-CM

## 2023-03-16 NOTE — Progress Notes (Signed)
  Subjective:  Patient ID: Evelyn Hurst, female    DOB: [birth year],  MRN: [id number]  Evelyn Hurst presents to clinic today for at risk foot care. Patient has h/o PAD and painful elongated mycotic toenails 1-5 bilaterally which are tender when wearing enclosed shoe gear. Pain is relieved with periodic professional debridement.  Chief Complaint  Patient presents with   Nail Problem    RFC PCP-Raju PCP VST-11/2022   New problem(s): None.   PCP is Thana Ates, MD.  Allergies  Allergen Reactions   Gold Sodium Thiosulfate Rash    erythematous patches on the eyelids   Ciprofloxacin     Other reaction(s): severe diarrhe   Gold     Other reaction(s): Unknown   Other     Gold sodium thiosulfate Palladium chloride   Palladium Chloride     Other reaction(s): allergy test Other reaction(s): Unknown   Silver Nitrate     Other reaction(s): allergy test    Review of Systems: Negative except as noted in the HPI.  Objective: No changes noted in today's physical examination. There were no vitals filed for this visit. Evelyn Hurst is a pleasant 88 y.o. female WD, WN in NAD. AAO x 3.  Neurovascular Examination: Capillary refill time to digits <3 seconds b/l. Palpable pedal pulses b/l LE. Pedal hair present. Lower extremity skin temperature gradient within normal limits. No edema noted b/l lower extremities. Digital hair sparse b/l.  Protective sensation intact 5/5 intact bilaterally with 10g monofilament b/l. Vibratory sensation intact b/l.  Dermatological:  Skin warm and supple b/l lower extremities. No open wounds b/l lower extremities. No interdigital macerations b/l lower extremities.   Toenails 1-5 b/l elongated, discolored, dystrophic, thickened, crumbly with subungual debris and tenderness to dorsal palpation.  No hyperkeratotic nor porokeratotic lesions present on today's visit.  Musculoskeletal:  Normal muscle strength 5/5 to all lower extremity muscle groups  bilaterally. No pain crepitus or joint limitation noted with ROM b/l lower extremities. No gross deformities b/l.  Assessment/Plan: 1. Pain due to onychomycosis of toenails of both feet   2. PAD (peripheral artery disease) ([facility])   -Consent given for treatment as described below: -Examined patient. -Patient to continue soft, supportive shoe gear daily. -Mycotic toenails 1-5 bilaterally were debrided in length and girth with sterile nail nippers and dremel without incident. -Patient/POA to call should there be question/concern in the interim.   Return in about 3 months (around 06/15/2023).  Freddie Breech, DPM

## 2023-03-23 DIAGNOSIS — L821 Other seborrheic keratosis: Secondary | ICD-10-CM | POA: Diagnosis not present

## 2023-03-23 DIAGNOSIS — L812 Freckles: Secondary | ICD-10-CM | POA: Diagnosis not present

## 2023-03-23 DIAGNOSIS — L718 Other rosacea: Secondary | ICD-10-CM | POA: Diagnosis not present

## 2023-03-23 DIAGNOSIS — L218 Other seborrheic dermatitis: Secondary | ICD-10-CM | POA: Diagnosis not present

## 2023-03-23 DIAGNOSIS — Z85828 Personal history of other malignant neoplasm of skin: Secondary | ICD-10-CM | POA: Diagnosis not present

## 2023-03-23 DIAGNOSIS — D1801 Hemangioma of skin and subcutaneous tissue: Secondary | ICD-10-CM | POA: Diagnosis not present

## 2023-05-26 ENCOUNTER — Ambulatory Visit
Admission: RE | Admit: 2023-05-26 | Discharge: 2023-05-26 | Disposition: A | Discharge status: Home / Self Care | Payer: Medicare Other | Source: Ambulatory Visit | Attending: Internal Medicine | Admitting: Internal Medicine

## 2023-05-26 DIAGNOSIS — M8589 Other specified disorders of bone density and structure, multiple sites: Secondary | ICD-10-CM

## 2023-05-26 DIAGNOSIS — E349 Endocrine disorder, unspecified: Secondary | ICD-10-CM | POA: Diagnosis not present

## 2023-05-26 DIAGNOSIS — M8588 Other specified disorders of bone density and structure, other site: Secondary | ICD-10-CM | POA: Diagnosis not present

## 2023-06-03 ENCOUNTER — Other Ambulatory Visit (INDEPENDENT_AMBULATORY_CARE_PROVIDER_SITE_OTHER): Payer: Medicare Other

## 2023-06-03 ENCOUNTER — Ambulatory Visit: Payer: Medicare Other | Admitting: Orthopedic Surgery

## 2023-06-03 DIAGNOSIS — M79602 Pain in left arm: Secondary | ICD-10-CM

## 2023-06-03 DIAGNOSIS — M79601 Pain in right arm: Secondary | ICD-10-CM

## 2023-06-04 ENCOUNTER — Encounter: Payer: Self-pay | Admitting: Orthopedic Surgery

## 2023-06-04 DIAGNOSIS — M810 Age-related osteoporosis without current pathological fracture: Secondary | ICD-10-CM | POA: Diagnosis not present

## 2023-06-04 NOTE — Progress Notes (Unsigned)
Office Visit Note   Patient: Evelyn Hurst           Date of Birth: 12-04-1934           MRN: [id number] Visit Date: 06/03/2023 Requested by: Thana Ates, MD [address] [place],  [place] [address] PCP: Thana Ates, MD  Subjective: Chief Complaint  Patient presents with   Other     Bilateral arm pain    HPI: Evelyn Hurst is a 88 y.o. female who presents to the office reporting bilateral shoulder pain.  Pain has been going on on the left-hand side for 2 weeks on and off.  Pain has been going on the right-hand side for several months but worse over the last 3 to 4 days.  She states she has been using the right 1 more because of the left shoulder pain the right one is sore.  The pain wakes her from sleep at night.  She is right-hand dominant.  Denies any radicular pain.  Does report some neck pain as well as muscle soreness.  Reaching behind her and pulling her pants up is difficult.  Takes Motrin in the morning which is able to hold her all day.  She has had prior injections in the shoulders in the past which have not helped at all.  She also reports having some balance issues..                ROS: All systems reviewed are negative as they relate to the chief complaint within the history of present illness.  Patient denies fevers or chills.  Assessment & Plan: Visit Diagnoses:  1. Bilateral arm pain     Plan: Impression is bilateral shoulder pain with maintained motion bilaterally but some weakness on the left indicating higher likelihood of rotator cuff pathology on that side.  She does not want injections.  Takes Tylenol and Motrin which helps.  She is able to get her arms up overhead.  She would like to try some physical therapy both for her shoulders as well as balance.  Will get that set up for her and see her back as needed.  Follow-Up Instructions: No follow-ups on file.   Orders:  Orders Placed This Encounter  Procedures   XR Cervical Spine 2 or 3  views   XR Shoulder Right   XR Shoulder Left   No orders of the defined types were placed in this encounter.     Procedures: No procedures performed   Clinical Data: No additional findings.  Objective: Vital Signs: There were no vitals taken for this visit.  Physical Exam:  Constitutional: Patient appears well-developed HEENT:  Head: Normocephalic Eyes:EOM are normal Neck: Normal range of motion Cardiovascular: Normal rate Pulmonary/chest: Effort normal Neurologic: Patient is alert Skin: Skin is warm Psychiatric: Patient has normal mood and affect  Ortho Exam: Ortho exam demonstrates passive range of motion bilaterally of 60/110/165.  5- out of 5 weakness to external rotation on the left compared to 5+ out of 5 on the right.  Subscap strength 5+ out of 5 bilaterally.  No discrete AC joint tenderness.  Motor or sensory function of both arms intact.  No paresthesias C5-T1.  Reflexes symmetric.  Neck range of motion mildly tender with rotation and less tender with flexion and extension.  O'Brien's testing equivocal on both shoulders.  Not too much in terms of discrete coarseness with internal and external rotation of the shoulder 90 degrees of abduction.  As it is a little bit worse on the left than the right.  Specialty Comments:  No specialty comments available.  Imaging: No results found.   PMFS History: Patient Active Problem List   Diagnosis Date Noted   Other specified disorders of bone density and structure, left thigh 01/26/2022   Impaired fasting glucose 04/23/2021   Cervical spondylosis without myelopathy 01/08/2021   Hypercholesterolemia 01/08/2021   Hypothyroidism 01/08/2021   Lumbosacral spondylosis without myelopathy 01/08/2021   Allergic contact dermatitis 05/10/2012   Dermatitis 05/04/2012   Past Medical History:  Diagnosis Date   Thyroid disease     Family History  Problem Relation Age of Onset   Diabetes Mother    Heart disease Father      Past Surgical History:  Procedure Laterality Date   APPENDECTOMY     CATARACT EXTRACTION BILATERAL W/ ANTERIOR VITRECTOMY     TUBAL LIGATION     Social History   Occupational History   Occupation: semi retired IT trainer  Tobacco Use   Smoking status: Former   Smokeless tobacco: Never  Substance and Sexual Activity   Alcohol use: Yes    Alcohol/week: 7.0 standard drinks of alcohol    Types: 7 Standard drinks or equivalent per week    Comment: wine   Drug use: Not on file   Sexual activity: Not on file

## 2023-06-11 DIAGNOSIS — M75112 Incomplete rotator cuff tear or rupture of left shoulder, not specified as traumatic: Secondary | ICD-10-CM | POA: Diagnosis not present

## 2023-06-11 DIAGNOSIS — R262 Difficulty in walking, not elsewhere classified: Secondary | ICD-10-CM | POA: Diagnosis not present

## 2023-06-11 DIAGNOSIS — M6281 Muscle weakness (generalized): Secondary | ICD-10-CM | POA: Diagnosis not present

## 2023-06-17 DIAGNOSIS — M75112 Incomplete rotator cuff tear or rupture of left shoulder, not specified as traumatic: Secondary | ICD-10-CM | POA: Diagnosis not present

## 2023-06-17 DIAGNOSIS — M6281 Muscle weakness (generalized): Secondary | ICD-10-CM | POA: Diagnosis not present

## 2023-06-17 DIAGNOSIS — R262 Difficulty in walking, not elsewhere classified: Secondary | ICD-10-CM | POA: Diagnosis not present

## 2023-06-18 DIAGNOSIS — M6281 Muscle weakness (generalized): Secondary | ICD-10-CM | POA: Diagnosis not present

## 2023-06-18 DIAGNOSIS — M75112 Incomplete rotator cuff tear or rupture of left shoulder, not specified as traumatic: Secondary | ICD-10-CM | POA: Diagnosis not present

## 2023-06-18 DIAGNOSIS — R262 Difficulty in walking, not elsewhere classified: Secondary | ICD-10-CM | POA: Diagnosis not present

## 2023-06-22 ENCOUNTER — Telehealth: Payer: Self-pay | Admitting: Orthopedic Surgery

## 2023-06-22 NOTE — Telephone Encounter (Signed)
Patient called to ask to see if she can get some medication for night and day for her pain.CB#[phone]

## 2023-06-26 ENCOUNTER — Other Ambulatory Visit: Payer: Self-pay | Admitting: Surgical

## 2023-06-26 MED ORDER — DICLOFENAC SODIUM 50 MG PO TBEC: 50.0000 mg | DELAYED_RELEASE_TABLET | Freq: Two times a day (BID) | ORAL | 0 refills | Status: DC | PRN

## 2023-06-26 NOTE — Telephone Encounter (Signed)
Sent in prescriptions for diclofenac which was prescribed by Dr. August Saucer before.  Hope this will help.

## 2023-06-29 DIAGNOSIS — M75112 Incomplete rotator cuff tear or rupture of left shoulder, not specified as traumatic: Secondary | ICD-10-CM | POA: Diagnosis not present

## 2023-06-29 DIAGNOSIS — M6281 Muscle weakness (generalized): Secondary | ICD-10-CM | POA: Diagnosis not present

## 2023-06-29 DIAGNOSIS — R262 Difficulty in walking, not elsewhere classified: Secondary | ICD-10-CM | POA: Diagnosis not present

## 2023-07-05 ENCOUNTER — Ambulatory Visit: Payer: Medicare Other | Admitting: Podiatry

## 2023-07-06 DIAGNOSIS — M75112 Incomplete rotator cuff tear or rupture of left shoulder, not specified as traumatic: Secondary | ICD-10-CM | POA: Diagnosis not present

## 2023-07-06 DIAGNOSIS — M6281 Muscle weakness (generalized): Secondary | ICD-10-CM | POA: Diagnosis not present

## 2023-07-06 DIAGNOSIS — R262 Difficulty in walking, not elsewhere classified: Secondary | ICD-10-CM | POA: Diagnosis not present

## 2023-07-12 DIAGNOSIS — R262 Difficulty in walking, not elsewhere classified: Secondary | ICD-10-CM | POA: Diagnosis not present

## 2023-07-12 DIAGNOSIS — M6281 Muscle weakness (generalized): Secondary | ICD-10-CM | POA: Diagnosis not present

## 2023-07-12 DIAGNOSIS — M75112 Incomplete rotator cuff tear or rupture of left shoulder, not specified as traumatic: Secondary | ICD-10-CM | POA: Diagnosis not present

## 2023-07-14 DIAGNOSIS — R262 Difficulty in walking, not elsewhere classified: Secondary | ICD-10-CM | POA: Diagnosis not present

## 2023-07-14 DIAGNOSIS — M75112 Incomplete rotator cuff tear or rupture of left shoulder, not specified as traumatic: Secondary | ICD-10-CM | POA: Diagnosis not present

## 2023-07-14 DIAGNOSIS — M6281 Muscle weakness (generalized): Secondary | ICD-10-CM | POA: Diagnosis not present

## 2023-07-20 DIAGNOSIS — R262 Difficulty in walking, not elsewhere classified: Secondary | ICD-10-CM | POA: Diagnosis not present

## 2023-07-20 DIAGNOSIS — M75112 Incomplete rotator cuff tear or rupture of left shoulder, not specified as traumatic: Secondary | ICD-10-CM | POA: Diagnosis not present

## 2023-07-20 DIAGNOSIS — M6281 Muscle weakness (generalized): Secondary | ICD-10-CM | POA: Diagnosis not present

## 2023-07-21 DIAGNOSIS — Z961 Presence of intraocular lens: Secondary | ICD-10-CM | POA: Diagnosis not present

## 2023-07-21 DIAGNOSIS — H353131 Nonexudative age-related macular degeneration, bilateral, early dry stage: Secondary | ICD-10-CM | POA: Diagnosis not present

## 2023-07-21 DIAGNOSIS — H52203 Unspecified astigmatism, bilateral: Secondary | ICD-10-CM | POA: Diagnosis not present

## 2023-07-22 DIAGNOSIS — M6281 Muscle weakness (generalized): Secondary | ICD-10-CM | POA: Diagnosis not present

## 2023-07-22 DIAGNOSIS — M75112 Incomplete rotator cuff tear or rupture of left shoulder, not specified as traumatic: Secondary | ICD-10-CM | POA: Diagnosis not present

## 2023-07-22 DIAGNOSIS — R262 Difficulty in walking, not elsewhere classified: Secondary | ICD-10-CM | POA: Diagnosis not present

## 2023-07-27 DIAGNOSIS — R262 Difficulty in walking, not elsewhere classified: Secondary | ICD-10-CM | POA: Diagnosis not present

## 2023-07-27 DIAGNOSIS — M75112 Incomplete rotator cuff tear or rupture of left shoulder, not specified as traumatic: Secondary | ICD-10-CM | POA: Diagnosis not present

## 2023-07-27 DIAGNOSIS — M6281 Muscle weakness (generalized): Secondary | ICD-10-CM | POA: Diagnosis not present

## 2023-07-29 DIAGNOSIS — M75112 Incomplete rotator cuff tear or rupture of left shoulder, not specified as traumatic: Secondary | ICD-10-CM | POA: Diagnosis not present

## 2023-07-29 DIAGNOSIS — R262 Difficulty in walking, not elsewhere classified: Secondary | ICD-10-CM | POA: Diagnosis not present

## 2023-07-29 DIAGNOSIS — M6281 Muscle weakness (generalized): Secondary | ICD-10-CM | POA: Diagnosis not present

## 2023-08-03 DIAGNOSIS — R7301 Impaired fasting glucose: Secondary | ICD-10-CM | POA: Diagnosis not present

## 2023-08-03 DIAGNOSIS — E039 Hypothyroidism, unspecified: Secondary | ICD-10-CM | POA: Diagnosis not present

## 2023-08-03 DIAGNOSIS — E7800 Pure hypercholesterolemia, unspecified: Secondary | ICD-10-CM | POA: Diagnosis not present

## 2023-08-06 DIAGNOSIS — R262 Difficulty in walking, not elsewhere classified: Secondary | ICD-10-CM | POA: Diagnosis not present

## 2023-08-06 DIAGNOSIS — M75112 Incomplete rotator cuff tear or rupture of left shoulder, not specified as traumatic: Secondary | ICD-10-CM | POA: Diagnosis not present

## 2023-08-06 DIAGNOSIS — M6281 Muscle weakness (generalized): Secondary | ICD-10-CM | POA: Diagnosis not present

## 2023-08-12 DIAGNOSIS — M75112 Incomplete rotator cuff tear or rupture of left shoulder, not specified as traumatic: Secondary | ICD-10-CM | POA: Diagnosis not present

## 2023-08-12 DIAGNOSIS — R262 Difficulty in walking, not elsewhere classified: Secondary | ICD-10-CM | POA: Diagnosis not present

## 2023-08-12 DIAGNOSIS — M6281 Muscle weakness (generalized): Secondary | ICD-10-CM | POA: Diagnosis not present

## 2023-09-13 ENCOUNTER — Ambulatory Visit: Payer: Medicare Other | Admitting: Podiatry

## 2023-09-13 DIAGNOSIS — M79674 Pain in right toe(s): Secondary | ICD-10-CM

## 2023-09-13 DIAGNOSIS — M79675 Pain in left toe(s): Secondary | ICD-10-CM | POA: Diagnosis not present

## 2023-09-13 DIAGNOSIS — L608 Other nail disorders: Secondary | ICD-10-CM | POA: Diagnosis not present

## 2023-09-13 DIAGNOSIS — B351 Tinea unguium: Secondary | ICD-10-CM

## 2023-09-13 NOTE — Progress Notes (Unsigned)
  Nails x 10.  Left hallux nail has greenish blackish discoloration.  Will send this off to [facility] for fungal culture and rule out bacterial involvement.  Follow-up with Dr. Donzetta Matters as scheduled

## 2023-09-15 DIAGNOSIS — R040 Epistaxis: Secondary | ICD-10-CM | POA: Diagnosis not present

## 2023-09-15 DIAGNOSIS — Z7189 Other specified counseling: Secondary | ICD-10-CM | POA: Diagnosis not present

## 2023-09-21 DIAGNOSIS — E039 Hypothyroidism, unspecified: Secondary | ICD-10-CM | POA: Diagnosis not present

## 2023-09-21 DIAGNOSIS — E7800 Pure hypercholesterolemia, unspecified: Secondary | ICD-10-CM | POA: Diagnosis not present

## 2023-09-21 DIAGNOSIS — R7301 Impaired fasting glucose: Secondary | ICD-10-CM | POA: Diagnosis not present

## 2023-09-23 DIAGNOSIS — L820 Inflamed seborrheic keratosis: Secondary | ICD-10-CM | POA: Diagnosis not present

## 2023-09-23 DIAGNOSIS — M7981 Nontraumatic hematoma of soft tissue: Secondary | ICD-10-CM | POA: Diagnosis not present

## 2023-09-23 DIAGNOSIS — L812 Freckles: Secondary | ICD-10-CM | POA: Diagnosis not present

## 2023-09-23 DIAGNOSIS — Z85828 Personal history of other malignant neoplasm of skin: Secondary | ICD-10-CM | POA: Diagnosis not present

## 2023-09-23 DIAGNOSIS — D1801 Hemangioma of skin and subcutaneous tissue: Secondary | ICD-10-CM | POA: Diagnosis not present

## 2023-09-23 DIAGNOSIS — L821 Other seborrheic keratosis: Secondary | ICD-10-CM | POA: Diagnosis not present

## 2023-09-23 DIAGNOSIS — L309 Dermatitis, unspecified: Secondary | ICD-10-CM | POA: Diagnosis not present

## 2023-09-28 ENCOUNTER — Other Ambulatory Visit: Payer: Self-pay | Admitting: Podiatry

## 2023-09-28 ENCOUNTER — Encounter: Payer: Self-pay | Admitting: Podiatry

## 2023-09-28 DIAGNOSIS — B351 Tinea unguium: Secondary | ICD-10-CM

## 2023-09-28 MED ORDER — EFINACONAZOLE 10 % EX SOLN: 1.0000 [drp] | Freq: Every day | CUTANEOUS | 11 refills | Status: DC

## 2023-09-28 NOTE — Progress Notes (Signed)
Received pathology report from [facility] for toenail clippings from left great toenail ===> +fungus (T. Rubrum)  Rx Jublia sent to her pharmacy.  Patient messaged through MyChart about it.

## 2023-09-30 DIAGNOSIS — M75112 Incomplete rotator cuff tear or rupture of left shoulder, not specified as traumatic: Secondary | ICD-10-CM | POA: Diagnosis not present

## 2023-09-30 DIAGNOSIS — M6281 Muscle weakness (generalized): Secondary | ICD-10-CM | POA: Diagnosis not present

## 2023-09-30 DIAGNOSIS — R262 Difficulty in walking, not elsewhere classified: Secondary | ICD-10-CM | POA: Diagnosis not present

## 2023-10-20 DIAGNOSIS — M6281 Muscle weakness (generalized): Secondary | ICD-10-CM | POA: Diagnosis not present

## 2023-10-20 DIAGNOSIS — M75112 Incomplete rotator cuff tear or rupture of left shoulder, not specified as traumatic: Secondary | ICD-10-CM | POA: Diagnosis not present

## 2023-10-20 DIAGNOSIS — R262 Difficulty in walking, not elsewhere classified: Secondary | ICD-10-CM | POA: Diagnosis not present

## 2023-10-26 DIAGNOSIS — Z23 Encounter for immunization: Secondary | ICD-10-CM | POA: Diagnosis not present

## 2023-11-04 DIAGNOSIS — H8190 Unspecified disorder of vestibular function, unspecified ear: Secondary | ICD-10-CM | POA: Diagnosis not present

## 2023-11-04 DIAGNOSIS — R040 Epistaxis: Secondary | ICD-10-CM | POA: Diagnosis not present

## 2023-11-23 ENCOUNTER — Ambulatory Visit: Payer: Medicare Other | Admitting: Podiatry

## 2023-12-17 DIAGNOSIS — M75112 Incomplete rotator cuff tear or rupture of left shoulder, not specified as traumatic: Secondary | ICD-10-CM | POA: Diagnosis not present

## 2023-12-17 DIAGNOSIS — R262 Difficulty in walking, not elsewhere classified: Secondary | ICD-10-CM | POA: Diagnosis not present

## 2023-12-17 DIAGNOSIS — M6281 Muscle weakness (generalized): Secondary | ICD-10-CM | POA: Diagnosis not present

## 2023-12-22 ENCOUNTER — Encounter: Payer: Self-pay | Admitting: Podiatry

## 2023-12-22 ENCOUNTER — Ambulatory Visit: Payer: Medicare Other | Admitting: Podiatry

## 2023-12-22 VITALS — Ht 63.0 in | Wt 157.0 lb

## 2023-12-22 DIAGNOSIS — B351 Tinea unguium: Secondary | ICD-10-CM

## 2023-12-22 DIAGNOSIS — M79674 Pain in right toe(s): Secondary | ICD-10-CM

## 2023-12-22 DIAGNOSIS — M79675 Pain in left toe(s): Secondary | ICD-10-CM | POA: Diagnosis not present

## 2023-12-24 DIAGNOSIS — Z0000 Encounter for general adult medical examination without abnormal findings: Secondary | ICD-10-CM | POA: Diagnosis not present

## 2023-12-24 DIAGNOSIS — M810 Age-related osteoporosis without current pathological fracture: Secondary | ICD-10-CM | POA: Diagnosis not present

## 2023-12-24 DIAGNOSIS — R7303 Prediabetes: Secondary | ICD-10-CM | POA: Diagnosis not present

## 2023-12-24 DIAGNOSIS — I6523 Occlusion and stenosis of bilateral carotid arteries: Secondary | ICD-10-CM | POA: Diagnosis not present

## 2023-12-24 DIAGNOSIS — E039 Hypothyroidism, unspecified: Secondary | ICD-10-CM | POA: Diagnosis not present

## 2023-12-24 DIAGNOSIS — E7800 Pure hypercholesterolemia, unspecified: Secondary | ICD-10-CM | POA: Diagnosis not present

## 2023-12-24 DIAGNOSIS — K5792 Diverticulitis of intestine, part unspecified, without perforation or abscess without bleeding: Secondary | ICD-10-CM | POA: Diagnosis not present

## 2023-12-29 NOTE — Progress Notes (Signed)
  Subjective:  Patient ID: Evelyn Hurst, female    DOB: [birth year],  MRN: [id number]  Evelyn Hurst presents to clinic today for: painful, elongated thickened toenails x 10 which are symptomatic when wearing enclosed shoe gear. This interferes with his/her daily activities. She relates tenderness to left great toe on today. She has been prescribed Jublia  solution for her toenails. Chief Complaint  Patient presents with   RFC    She is here for a nail trim, she is seeing her this week, and PCP is Dr. Dwight,     PCP is Dwight Trula SQUIBB, MD.  Allergies  Allergen Reactions   Gold Sodium Thiosulfate Rash    erythematous patches on the eyelids   Ciprofloxacin     Other reaction(s): severe diarrhe   Gold     Other reaction(s): Unknown   Other     Gold sodium thiosulfate Palladium chloride   Palladium Chloride     Other reaction(s): allergy test Other reaction(s): Unknown   Silver Nitrate     Other reaction(s): allergy test    Review of Systems: Negative except as noted in the HPI.  Objective: No changes noted in today's physical examination. There were no vitals filed for this visit.  Evelyn Hurst is a pleasant 89 y.o. female WD, WN in NAD. AAO x 3.  Vascular Examination: Capillary refill time <3 seconds b/l LE. Palpable pedal pulses b/l LE. Digital hair present b/l. No pedal edema b/l. Skin temperature gradient WNL b/l. No varicosities b/l. No cyanosis or clubbing noted b/l LE.[name]  Dermatological Examination: Pedal skin with normal turgor, texture and tone b/l. No open wounds. No interdigital macerations b/l. Toenails 1-5 b/l thickened, discolored, dystrophic with subungual debris. There is pain on palpation to dorsal aspect of nailplates. No corns, calluses nor porokeratotic lesions noted..  Neurological Examination: Protective sensation intact with 10 gram monofilament b/l LE. Vibratory sensation intact b/l LE.   Musculoskeletal Examination: Muscle strength 5/5 to all  lower extremity muscle groups bilaterally. No pain, crepitus or joint limitation noted with ROM bilateral LE. No gross bony deformities bilaterally.  Assessment/Plan: 1. Pain due to onychomycosis of toenails of both feet     Patient was evaluated and treated. All patient's and/or POA's questions/concerns addressed on today's visit. Toenails 1-5 debrided in length and girth without incident. Continue soft, supportive shoe gear daily. Report any pedal injuries to medical professional. Call office if there are any questions/concerns. -Patient/POA to call should there be question/concern in the interim.   Return in about 3 months (around 03/20/2024).  Delon LITTIE Merlin, DPM      [facility] LOCATION: [address] [place] [address]                   Office [phone]   [facility] LOCATION: [address] [place], [place] [address] Office [phone]

## 2024-01-13 DIAGNOSIS — R262 Difficulty in walking, not elsewhere classified: Secondary | ICD-10-CM | POA: Diagnosis not present

## 2024-01-13 DIAGNOSIS — M75112 Incomplete rotator cuff tear or rupture of left shoulder, not specified as traumatic: Secondary | ICD-10-CM | POA: Diagnosis not present

## 2024-01-13 DIAGNOSIS — M6281 Muscle weakness (generalized): Secondary | ICD-10-CM | POA: Diagnosis not present

## 2024-01-31 DIAGNOSIS — M75112 Incomplete rotator cuff tear or rupture of left shoulder, not specified as traumatic: Secondary | ICD-10-CM | POA: Diagnosis not present

## 2024-01-31 DIAGNOSIS — M6281 Muscle weakness (generalized): Secondary | ICD-10-CM | POA: Diagnosis not present

## 2024-01-31 DIAGNOSIS — R262 Difficulty in walking, not elsewhere classified: Secondary | ICD-10-CM | POA: Diagnosis not present

## 2024-02-08 DIAGNOSIS — R262 Difficulty in walking, not elsewhere classified: Secondary | ICD-10-CM | POA: Diagnosis not present

## 2024-02-08 DIAGNOSIS — M75112 Incomplete rotator cuff tear or rupture of left shoulder, not specified as traumatic: Secondary | ICD-10-CM | POA: Diagnosis not present

## 2024-02-08 DIAGNOSIS — M6281 Muscle weakness (generalized): Secondary | ICD-10-CM | POA: Diagnosis not present

## 2024-02-14 DIAGNOSIS — M75112 Incomplete rotator cuff tear or rupture of left shoulder, not specified as traumatic: Secondary | ICD-10-CM | POA: Diagnosis not present

## 2024-02-14 DIAGNOSIS — M6281 Muscle weakness (generalized): Secondary | ICD-10-CM | POA: Diagnosis not present

## 2024-02-14 DIAGNOSIS — R262 Difficulty in walking, not elsewhere classified: Secondary | ICD-10-CM | POA: Diagnosis not present

## 2024-03-21 ENCOUNTER — Ambulatory Visit: Payer: Medicare Other | Admitting: Podiatry

## 2024-03-21 DIAGNOSIS — M79674 Pain in right toe(s): Secondary | ICD-10-CM

## 2024-03-21 DIAGNOSIS — M79675 Pain in left toe(s): Secondary | ICD-10-CM | POA: Diagnosis not present

## 2024-03-21 DIAGNOSIS — I739 Peripheral vascular disease, unspecified: Secondary | ICD-10-CM

## 2024-03-21 DIAGNOSIS — L84 Corns and callosities: Secondary | ICD-10-CM | POA: Diagnosis not present

## 2024-03-21 DIAGNOSIS — B351 Tinea unguium: Secondary | ICD-10-CM

## 2024-03-21 NOTE — Progress Notes (Signed)
       Subjective:  Patient ID: Evelyn Hurst, female    DOB: 01-10-1935,  MRN: [id number]  Evelyn Hurst presents to clinic today for:  Chief Complaint  Patient presents with   RFC    Prediabetic, last A1c: 6.4. no anticoag. Need nails trimmed and right heel has thickened skin on it.    Patient notes nails are thick and elongated, causing pain in shoe gear when ambulating.  She notes a callus on the back of the right heel.  She typically sees Dr. Denece Finger for her nail care.   PCP is Tena Feeling, MD.  Last seen 10/26/23.  Past Medical History:  Diagnosis Date   Thyroid  disease     Allergies  Allergen Reactions   Gold Sodium Thiosulfate Rash    erythematous patches on the eyelids   Ciprofloxacin Other (See Comments)    Other reaction(s): severe diarrhe   Gold     Other reaction(s): Unknown   Other     Gold sodium thiosulfate Palladium chloride   Palladium Chloride     Other reaction(s): allergy test Other reaction(s): Unknown   Silver Nitrate Itching    Other reaction(s): allergy test    Objective:  Evelyn Hurst is a pleasant 89 y.o. female in NAD. AAO x 3.  Vascular Examination: Patient has palpable DP pulse, absent PT pulse bilateral.  Delayed capillary refill bilateral toes.  Sparse digital hair bilateral.  Proximal to distal cooling WNL bilateral.    Dermatological Examination: Interspaces are clear with no open lesions noted bilateral.  Skin is shiny and atrophic bilateral.  Nails are 3-47mm thick, with yellowish/brown discoloration, subungual debris and distal onycholysis x10.  There is pain with compression of nails x10.  There are hyperkeratotic lesions noted on posterolateral aspect of the right heel.  Patient qualifies for at-risk foot care because of PAD .  Assessment/Plan: 1. Pain due to onychomycosis of toenails of both feet   2. PAD (peripheral artery disease) (HCC)   3. Callus of foot    Mycotic nails x10 were sharply debrided with sterile  nail nippers and power debriding burr to decrease bulk and length.  Hyperkeratotic lesions on the right heel was sanded with a power-driven umbrella burr.    Return in about 3 months (around 06/21/2024) for RFC (with either me or Dr. Denece Finger).   Joe Murders, DPM, FACFAS [facility]     [address] [place], [place] [address]                Office [phone]  Fax [phone]

## 2024-03-22 DIAGNOSIS — E039 Hypothyroidism, unspecified: Secondary | ICD-10-CM | POA: Diagnosis not present

## 2024-03-22 DIAGNOSIS — E7800 Pure hypercholesterolemia, unspecified: Secondary | ICD-10-CM | POA: Diagnosis not present

## 2024-03-22 DIAGNOSIS — R7301 Impaired fasting glucose: Secondary | ICD-10-CM | POA: Diagnosis not present

## 2024-03-23 DIAGNOSIS — L438 Other lichen planus: Secondary | ICD-10-CM | POA: Diagnosis not present

## 2024-03-23 DIAGNOSIS — L718 Other rosacea: Secondary | ICD-10-CM | POA: Diagnosis not present

## 2024-03-23 DIAGNOSIS — D485 Neoplasm of uncertain behavior of skin: Secondary | ICD-10-CM | POA: Diagnosis not present

## 2024-03-23 DIAGNOSIS — D1801 Hemangioma of skin and subcutaneous tissue: Secondary | ICD-10-CM | POA: Diagnosis not present

## 2024-03-23 DIAGNOSIS — D0462 Carcinoma in situ of skin of left upper limb, including shoulder: Secondary | ICD-10-CM | POA: Diagnosis not present

## 2024-03-23 DIAGNOSIS — Z85828 Personal history of other malignant neoplasm of skin: Secondary | ICD-10-CM | POA: Diagnosis not present

## 2024-03-23 DIAGNOSIS — L239 Allergic contact dermatitis, unspecified cause: Secondary | ICD-10-CM | POA: Diagnosis not present

## 2024-03-23 DIAGNOSIS — L812 Freckles: Secondary | ICD-10-CM | POA: Diagnosis not present

## 2024-03-23 DIAGNOSIS — L821 Other seborrheic keratosis: Secondary | ICD-10-CM | POA: Diagnosis not present

## 2024-03-28 DIAGNOSIS — E039 Hypothyroidism, unspecified: Secondary | ICD-10-CM | POA: Diagnosis not present

## 2024-03-28 DIAGNOSIS — R7301 Impaired fasting glucose: Secondary | ICD-10-CM | POA: Diagnosis not present

## 2024-03-28 DIAGNOSIS — E7800 Pure hypercholesterolemia, unspecified: Secondary | ICD-10-CM | POA: Diagnosis not present

## 2024-06-20 ENCOUNTER — Ambulatory Visit (INDEPENDENT_AMBULATORY_CARE_PROVIDER_SITE_OTHER): Payer: Medicare Other | Admitting: Podiatry

## 2024-06-20 DIAGNOSIS — Z91198 Patient's noncompliance with other medical treatment and regimen for other reason: Secondary | ICD-10-CM

## 2024-06-21 NOTE — Progress Notes (Signed)
 1. Failure to attend appointment with reason given    Appt canceled by clinic. Power outage in office.

## 2024-06-26 ENCOUNTER — Ambulatory Visit: Admitting: Podiatry

## 2024-07-04 DIAGNOSIS — L308 Other specified dermatitis: Secondary | ICD-10-CM | POA: Diagnosis not present

## 2024-07-10 ENCOUNTER — Ambulatory Visit: Admitting: Podiatry

## 2024-07-26 DIAGNOSIS — H52203 Unspecified astigmatism, bilateral: Secondary | ICD-10-CM | POA: Diagnosis not present

## 2024-07-26 DIAGNOSIS — Z961 Presence of intraocular lens: Secondary | ICD-10-CM | POA: Diagnosis not present

## 2024-07-26 DIAGNOSIS — H353131 Nonexudative age-related macular degeneration, bilateral, early dry stage: Secondary | ICD-10-CM | POA: Diagnosis not present

## 2024-08-14 ENCOUNTER — Telehealth: Payer: Self-pay | Admitting: Podiatry

## 2024-08-14 ENCOUNTER — Encounter: Payer: Self-pay | Admitting: Podiatry

## 2024-08-14 ENCOUNTER — Ambulatory Visit: Admitting: Podiatry

## 2024-08-14 DIAGNOSIS — M79674 Pain in right toe(s): Secondary | ICD-10-CM

## 2024-08-14 DIAGNOSIS — B351 Tinea unguium: Secondary | ICD-10-CM | POA: Diagnosis not present

## 2024-08-14 DIAGNOSIS — M79675 Pain in left toe(s): Secondary | ICD-10-CM

## 2024-08-14 DIAGNOSIS — M722 Plantar fascial fibromatosis: Secondary | ICD-10-CM | POA: Diagnosis not present

## 2024-08-14 NOTE — Patient Instructions (Signed)

## 2024-08-14 NOTE — Progress Notes (Unsigned)
 Subjective:  Patient ID: Evelyn Hurst, female    DOB: Feb 07, 1935,  MRN: [id number]  BERKLEE BATTEY presents to clinic today for:  Chief Complaint  Patient presents with   [facility]    RFC Non diabetic toenail trim. Right foot heel pain x 1 week. 5 pain. 0 treatment. Elevating.   Patient notes nails are thick, discolored, elongated and painful in shoegear when trying to ambulate.  States that Dr. Gaynel typically takes around the sides of the toenails and gets any debris or feeling of ingrown nail resolved.  She would like to have this performed like Dr. Gaynel does it.  She denies any drainage or active ingrown toenails today.  She also notes pain in the bottom of the right foot x 1 week.  She expressed that she is not interested in a cortisone injection today but is open to physical therapy.  PCP is Dwight Trula SQUIBB, MD.  Past Medical History:  Diagnosis Date   Thyroid  disease    Past Surgical History:  Procedure Laterality Date   APPENDECTOMY     CATARACT EXTRACTION BILATERAL W/ ANTERIOR VITRECTOMY     TUBAL LIGATION     Allergies  Allergen Reactions   Gold Sodium Thiosulfate Rash    erythematous patches on the eyelids   Ciprofloxacin Other (See Comments)    Other reaction(s): severe diarrhe   Gold     Other reaction(s): Unknown   Other     Gold sodium thiosulfate Palladium chloride   Palladium Chloride     Other reaction(s): allergy test Other reaction(s): Unknown   Silver Nitrate Itching    Other reaction(s): allergy test    Review of Systems: Negative except as noted in the HPI.  Objective:  Evelyn Hurst is a pleasant 89 y.o. female in NAD. AAO x 3.  Vascular Examination: Capillary refill time is 3-5 seconds to toes bilateral. Palpable pedal pulses b/l LE. Digital hair present b/l.  Skin temperature gradient WNL b/l. No varicosities b/l. No cyanosis noted b/l.   Dermatological Examination: Pedal skin with normal turgor, texture and tone b/l. No open  wounds. No interdigital macerations b/l. Toenails x10 are 3mm thick, discolored, dystrophic with subungual debris. There is pain with compression of the nail plates.  They are elongated x10.  No evidence of onychocryptosis today.   Orthopedic examination: There is pain on palpation to the plantar aspect of the right heel.  Tight plantar fascia on palpation along the medial and central plantar fascial bands.  No gaps or nodules are noted within the plantar fascia.  Assessment/Plan: 1. Pain due to onychomycosis of toenails of both feet   2. Plantar fasciitis of right foot     AMB REFERRAL TO PHYSICAL THERAPY FOR HOME USE ONLY DME POWER STEP INSERTS  The mycotic toenails were sharply debrided x10 with sterile nail nippers and a power debriding burr to decrease bulk/thickness and length.  The hallux corners were cut back. Recommend she f/u with Dr. Gaynel since she prefers her way of nail trimming/debridement.    She was fitted for power step inserts to provide additional arch support and decrease strain to the plantar fascia.  She was given an external referral to physical therapy.  She can take it to the provider of her choosing since she did have a preference of where she preferred to go.  Stretching exercises were printed and dispensed for her to begin performing at home.  Return for 3 months for RFc  with Dr. Gaynel.   Awanda CHARM Imperial, DPM, FACFAS [facility]     [address] [place], [place] [address]                Office [phone]  Fax [phone]

## 2024-08-14 NOTE — Telephone Encounter (Signed)
 Pt called to say there is no need to fax papers to physical therapy pt.all set it up took paper copy to facility. Pt is all set up for physical therapy  [email or web address]  [facility]opedic best contact # for Pt. [phone] thank you.

## 2024-08-22 DIAGNOSIS — M722 Plantar fascial fibromatosis: Secondary | ICD-10-CM | POA: Insufficient documentation

## 2024-11-28 ENCOUNTER — Ambulatory Visit: Admitting: Podiatry

## 2024-11-28 ENCOUNTER — Encounter: Payer: Self-pay | Admitting: Podiatry

## 2024-11-28 DIAGNOSIS — M79675 Pain in left toe(s): Secondary | ICD-10-CM | POA: Diagnosis not present

## 2024-11-28 DIAGNOSIS — B351 Tinea unguium: Secondary | ICD-10-CM | POA: Diagnosis not present

## 2024-11-28 DIAGNOSIS — R7303 Prediabetes: Secondary | ICD-10-CM | POA: Insufficient documentation

## 2024-11-28 DIAGNOSIS — M79674 Pain in right toe(s): Secondary | ICD-10-CM

## 2024-11-28 DIAGNOSIS — I739 Peripheral vascular disease, unspecified: Secondary | ICD-10-CM | POA: Diagnosis not present

## 2024-11-28 DIAGNOSIS — I6523 Occlusion and stenosis of bilateral carotid arteries: Secondary | ICD-10-CM | POA: Insufficient documentation

## 2024-11-28 DIAGNOSIS — M8580 Other specified disorders of bone density and structure, unspecified site: Secondary | ICD-10-CM | POA: Insufficient documentation

## 2024-11-28 DIAGNOSIS — M810 Age-related osteoporosis without current pathological fracture: Secondary | ICD-10-CM | POA: Insufficient documentation

## 2024-12-04 ENCOUNTER — Other Ambulatory Visit: Payer: Self-pay

## 2024-12-04 ENCOUNTER — Emergency Department (HOSPITAL_BASED_OUTPATIENT_CLINIC_OR_DEPARTMENT_OTHER)
Admission: EM | Admit: 2024-12-04 | Discharge: 2024-12-04 | Disposition: A | Discharge status: Home / Self Care | Attending: Emergency Medicine | Admitting: Emergency Medicine

## 2024-12-04 ENCOUNTER — Encounter (HOSPITAL_BASED_OUTPATIENT_CLINIC_OR_DEPARTMENT_OTHER): Payer: Self-pay | Admitting: Radiology

## 2024-12-04 ENCOUNTER — Emergency Department (HOSPITAL_BASED_OUTPATIENT_CLINIC_OR_DEPARTMENT_OTHER)

## 2024-12-04 DIAGNOSIS — W01198A Fall on same level from slipping, tripping and stumbling with subsequent striking against other object, initial encounter: Secondary | ICD-10-CM | POA: Insufficient documentation

## 2024-12-04 DIAGNOSIS — Y92001 Dining room of unspecified non-institutional (private) residence as the place of occurrence of the external cause: Secondary | ICD-10-CM | POA: Diagnosis not present

## 2024-12-04 DIAGNOSIS — S82035A Nondisplaced transverse fracture of left patella, initial encounter for closed fracture: Secondary | ICD-10-CM

## 2024-12-04 DIAGNOSIS — S8992XA Unspecified injury of left lower leg, initial encounter: Secondary | ICD-10-CM | POA: Diagnosis present

## 2024-12-04 DIAGNOSIS — S82092A Other fracture of left patella, initial encounter for closed fracture: Secondary | ICD-10-CM | POA: Diagnosis not present

## 2024-12-04 NOTE — Progress Notes (Signed)
"  °  Subjective:  Patient ID: Evelyn Hurst, female    DOB: May 09, 1935,  MRN: [id number]  Evelyn Hurst presents to clinic today for painful thick toenails that are difficult to trim. Pain interferes with ambulation. Aggravating factors include wearing enclosed shoe gear. Pain is relieved with periodic professional debridement.  Chief Complaint  Patient presents with   Nail Problem    I'm here to see Dr. Gaynel for my nail clipping.     New problem(s): None.   PCP is Dwight Trula SQUIBB, MD.  Allergies[1]  Review of Systems: Negative except as noted in the HPI.  Objective: No changes noted in today's physical examination. There were no vitals filed for this visit. Evelyn Hurst is a pleasant 89 y.o. female WD, WN in NAD. AAO x 3.  Vascular Examination: Capillary refill time <3 seconds b/l LE. Palpable pedal pulses b/l LE. Digital hair present b/l. No pedal edema b/l. Skin temperature gradient WNL b/l. No varicosities b/l. No cyanosis or clubbing noted b/l LE.Evelyn Hurst  Dermatological Examination: Pedal skin with normal turgor, texture and tone b/l. No open wounds. No interdigital macerations b/l. Toenails 1-5 b/l thickened, discolored, dystrophic with subungual debris. There is pain on palpation to dorsal aspect of nailplates. No corns, calluses nor porokeratotic lesions noted..  Neurological Examination: Protective sensation intact with 10 gram monofilament b/l LE. Vibratory sensation intact b/l LE.   Musculoskeletal Examination: Muscle strength 5/5 to all lower extremity muscle groups bilaterally. No pain, crepitus or joint limitation noted with ROM bilateral LE. No gross bony deformities bilaterally.  Assessment/Plan: 1. Pain due to onychomycosis of toenails of both feet   Consent given for treatment. Patient examined. All patient's and/or POA's questions/concerns addressed on today's visit. Mycotic toenails 1-5 b/l debrided in length and girth without incident. Continue soft,  supportive shoe gear daily. Report any pedal injuries to medical professional. Call office if there are any quesitons/concerns. -Patient/POA to call should there be question/concern in the interim.   Return in about 3 months (around 02/26/2025).  Evelyn Hurst, DPM      [place] LOCATION: [address] [place] [address]                   Office [phone]   [place] LOCATION: [address] [place], [place] [address] Office [phone]     [1]  Allergies Allergen Reactions   Gold Sodium Thiosulfate Rash    erythematous patches on the eyelids   Ciprofloxacin Other (See Comments)    Other reaction(s): severe diarrhe   Gold     Other reaction(s): Unknown   Other     Gold sodium thiosulfate Palladium chloride   Palladium Chloride     Other reaction(s): allergy test Other reaction(s): Unknown   Silver Nitrate Itching    Other reaction(s): allergy test   "

## 2024-12-04 NOTE — ED Provider Notes (Signed)
 " [facility] EMERGENCY DEPARTMENT AT [facility] Provider Note   CSN: [id number] Arrival date & time: 12/04/24  1748     Patient presents with: [place]   Evelyn Hurst is a 89 y.o. female.  89 year old female presents emergency department with complaints of mechanical fall that occurred tonight at the dining hall at [facility].  Patient reports she was walking when she tripped over a rug and landed on her left knee, caught herself with her right hand, then hit her front right tooth on the ground.  Patient denies any loss of consciousness, blood thinners, head injury during this mechanical fall.  She is reporting pain in her left knee, right hand, front right tooth.  Patient advises her and her husband live at independent living at [facility] and she has not been able to walk since the incident due to pain.     Prior to Admission medications  Medication Sig Start Date End Date Taking? Authorizing Provider  B Complex Vitamins (VITAMIN B COMPLEX 100 IJ) one tablet    [provider]  Biotin 1000 MCG CHEW SMARTSIG:1 By Mouth 08/27/21   [provider]  CVS LUTEIN-ZEAXANTHIN PO Take 4 mg by mouth daily at 12 noon.    [provider]  D 1000 25 MCG (1000 UT) capsule SMARTSIG:1 By Mouth 08/27/21   [provider]  Dextran 70-Hypromellose, PF, (TEARS NATURALE FREE) 0.1-0.3 % SOLN     [provider]  diazepam (VALIUM) 5 MG tablet one half tablet as needed    [provider]  folic acid (FOLVITE) 400 MCG tablet one tablet    [provider]  Pediatric Multivitamins-Fl (MULTIVITAMINS/FL PO) one tablet    [provider]  SYNTHROID 50 MCG tablet Take 50 mcg by mouth every Monday, Tuesday, Wednesday, Thursday, and Friday.  10/15/19   [provider]  SYNTHROID 75 MCG tablet Take 75 mcg by mouth 2 (two) times a week. Saturday and Sunday 09/17/19   [provider]  vitamin C (ASCORBIC ACID) 500 MG tablet 1  tablet    [provider]    Allergies: Gold sodium thiosulfate, Ciprofloxacin, Gold, Other, Palladium chloride, and Silver nitrate    Review of Systems  Musculoskeletal:  Positive for arthralgias.  All other systems reviewed and are negative.   Updated Vital Signs BP (!) 173/86 (BP Location: Right Arm)   Pulse 86   Temp 97.7 F (36.5 C)   Resp 16   Ht 5' 3 (1.6 m)   Wt 65.8 kg   SpO2 100%   BMI 25.69 kg/m   Physical Exam Vitals and nursing note reviewed.  Constitutional:      General: She is not in acute distress.    Appearance: Normal appearance. She is not ill-appearing.  HENT:     Head: Normocephalic and atraumatic.     Nose: Nose normal.     Mouth/Throat:     Comments: Patient reports her front right tooth has been shifted during the fall.  It is not loose to palpation and there is some mild bleeding noted on the gumline. Eyes:     Extraocular Movements: Extraocular movements intact.     Conjunctiva/sclera: Conjunctivae normal.     Pupils: Pupils are equal, round, and reactive to light.  Cardiovascular:     Rate and Rhythm: Normal rate.  Pulmonary:     Effort: Pulmonary effort is normal. No respiratory distress.     Breath sounds: Normal breath sounds.  Abdominal:  General: Abdomen is flat.     Tenderness: There is no abdominal tenderness. There is no guarding.  Musculoskeletal:        General: Swelling and tenderness present.     Cervical back: Normal range of motion.     Comments: Some swelling noted in the left knee.  Patient has good pulses.  Patient has decreased range of motion but is able to flex and extend. Cap refill is less than 2 seconds.  Patient has some tenderness throughout the right hand with no obvious dislocation.  No clavicle pain to palpation.  Skin:    General: Skin is warm.     Capillary Refill: Capillary refill takes less than 2 seconds.  Neurological:     General: No focal deficit present.     Mental Status: She is  alert.  Psychiatric:        Mood and Affect: Mood normal.        Behavior: Behavior normal.     (all labs ordered are listed, but only abnormal results are displayed) Labs Reviewed - No data to display  EKG: None  Radiology: DG Knee Left Port Result Date: 12/04/2024 CLINICAL DATA:  Fall EXAM: PORTABLE LEFT KNEE - 1-2 VIEW COMPARISON:  12/03/2021 FINDINGS: Acute nondisplaced fracture involving the mid to lower aspect of the patella. Moderate knee effusion. No malalignment. Vascular calcifications IMPRESSION: Acute nondisplaced patellar fracture with moderate knee effusion. Electronically Signed   By: Luke Bun M.D.   On: 12/04/2024 20:25     Procedures   Medications Ordered in the ED - No data to display  89 y.o. female presents to the ED with complaints of ***, this involves an extensive number of treatment options, and is a complaint that carries with it a high risk of complications and morbidity.  The differential diagnosis includes *** (Ddx)  On arrival pt is nontoxic, vitals ***. Exam significant for ***  Additional history obtained from ***. Previous records obtained and reviewed ***  I ordered medication *** for ***  Lab Tests:  I Ordered, reviewed, and interpreted labs, which included:   Imaging Studies ordered:  I ordered imaging studies which included ***, I independently visualized and interpreted imaging which showed ***  ED Course:   Critical interventions: ***  I consulted *** and discussed lab and imaging findings    Portions of this note were generated with Dragon dictation software. Dictation errors may occur despite best attempts at proofreading.   Final diagnoses:  None    ED Discharge Orders     None        "

## 2024-12-04 NOTE — Discharge Instructions (Addendum)
 Your x-rays were concerning for a fracture of the kneecap.  I have given you a knee immobilizer in the emergency department today.  Please use your walker that you have at home.  Please use Tylenol  as needed for pain.  It is very important you follow-up with orthopedics as soon as possible for further recommendation for treatment.  I have given you a number to call for an orthopedic if your orthopedic cannot see you for any reason.  If you experience any new or worsening symptoms with the leg or knee please return to the emergency department for further evaluation.

## 2024-12-04 NOTE — ED Triage Notes (Signed)
 Pt endorses that she tripped on her carpet. She fell face first hitting her face on the floor. She is now having dental pain as one of her teeth is twisted behind the other now. She is also complaining of left knee pain that she is unable to bear weight on. She denies use of blood thinners. She denies any LOC. She has a small scrap to her upper lip.

## 2024-12-05 ENCOUNTER — Telehealth (HOSPITAL_BASED_OUTPATIENT_CLINIC_OR_DEPARTMENT_OTHER): Payer: Self-pay | Admitting: Student

## 2024-12-05 ENCOUNTER — Ambulatory Visit (INDEPENDENT_AMBULATORY_CARE_PROVIDER_SITE_OTHER): Admitting: Student

## 2024-12-05 DIAGNOSIS — S82035A Nondisplaced transverse fracture of left patella, initial encounter for closed fracture: Secondary | ICD-10-CM | POA: Diagnosis not present

## 2024-12-05 NOTE — Telephone Encounter (Signed)
 Patient needs an order sent to [facility] and it needs say for PT and OT and the reason the fax number [phone] ASAP

## 2024-12-05 NOTE — Progress Notes (Signed)
 "                                Chief Complaint: Left patella fracture    Discussed the use of AI scribe software for clinical note transcription with the patient, who gave verbal consent to proceed.  History of Present Illness Evelyn Hurst is an 89 year old female who presents with left knee pain and reduced mobility following an acute patellar fracture.  She tripped on carpet at about 6 PM yesterday and fell with direct impact to the left knee, landing prone. She does not recall all details of the fall. Since the injury she has been unable to bear weight on the left leg due to pain with ambulation and pressure. She uses a wheelchair. She has a walker at home but has difficulty using it due to limited upper body strength and balance, and crutches are not feasible. Pain improves when the knee is fully extended and worsens with certain movements and attempts at active extension. She denies paresthesia in the limb.  She has used intermittent ice with decreased swelling and minimal ecchymosis compared to the prior day. She has significant difficulty with transfers, bathing, and toileting and needs assistance for activities of daily living. She has plantar fasciitis in the contralateral foot that worsens balance and made a walking boot unsafe. She lives at [facility]   Surgical History:   None  PMH/PSH/Family History/Social History/Meds/Allergies:    Past Medical History:  Diagnosis Date   Thyroid  disease    Past Surgical History:  Procedure Laterality Date   APPENDECTOMY     CATARACT EXTRACTION BILATERAL W/ ANTERIOR VITRECTOMY     TUBAL LIGATION     Social History   Socioeconomic History   Marital status: Married    Spouse name: Not on file   Number of children: 2   Years of education: Not on file   Highest education level: Not on file  Occupational History   Occupation: semi retired IT TRAINER  Tobacco Use   Smoking status: Former   Smokeless tobacco: Never  Vaping Use    Vaping status: Never Used  Substance and Sexual Activity   Alcohol use: Yes    Alcohol/week: 7.0 standard drinks of alcohol    Types: 7 Standard drinks or equivalent per week    Comment: wine   Drug use: Never   Sexual activity: Not Currently  Other Topics Concern   Not on file  Social History Narrative   Not on file   Social Drivers of Health   Tobacco Use: Medium Risk (12/04/2024)   Patient History    Smoking Tobacco Use: Former    Smokeless Tobacco Use: Never    Passive Exposure: Not on Actuary Strain: Not on file  Food Insecurity: Not on file  Transportation Needs: Not on file  Physical Activity: Not on file  Stress: Not on file  Social Connections: Not on file  Depression (EYV7-0): Not on file  Alcohol Screen: Not on file  Housing: Not on file  Utilities: Not on file  Health Literacy: Not on file   Family History  Problem Relation Age of Onset   Diabetes Mother    Heart disease Father    Allergies[1] Current Outpatient Medications  Medication Sig Dispense Refill   B Complex Vitamins (VITAMIN B COMPLEX 100 IJ) one tablet     Biotin 1000 MCG CHEW SMARTSIG:1 By Mouth  CVS LUTEIN-ZEAXANTHIN PO Take 4 mg by mouth daily at 12 noon.     D 1000 25 MCG (1000 UT) capsule SMARTSIG:1 By Mouth     Dextran 70-Hypromellose, PF, (TEARS NATURALE FREE) 0.1-0.3 % SOLN      diazepam (VALIUM) 5 MG tablet one half tablet as needed     folic acid (FOLVITE) 400 MCG tablet one tablet     Pediatric Multivitamins-Fl (MULTIVITAMINS/FL PO) one tablet     SYNTHROID 50 MCG tablet Take 50 mcg by mouth every Monday, Tuesday, Wednesday, Thursday, and Friday.      SYNTHROID 75 MCG tablet Take 75 mcg by mouth 2 (two) times a week. Saturday and Sunday     vitamin C (ASCORBIC ACID) 500 MG tablet 1 tablet     No current facility-administered medications for this visit.   DG Hand Complete Right Result Date: 12/04/2024 EXAM: 3 OR MORE VIEW(S) XRAY OF THE RIGHT HAND 12/04/2024  09:34:00 PM COMPARISON: None available. CLINICAL HISTORY: fall FINDINGS: BONES AND JOINTS: Osteoarthritis within the IP joints, most pronounced in the third DIP joint. No acute fracture, subluxation, or dislocation. No malalignment. SOFT TISSUES: Unremarkable. IMPRESSION: 1. No acute bony abnormality. Electronically signed by: Franky Crease MD 12/04/2024 09:41 PM EST RP Workstation: [id number]   DG Knee Left Port Result Date: 12/04/2024 CLINICAL DATA:  Fall EXAM: PORTABLE LEFT KNEE - 1-2 VIEW COMPARISON:  12/03/2021 FINDINGS: Acute nondisplaced fracture involving the mid to lower aspect of the patella. Moderate knee effusion. No malalignment. Vascular calcifications IMPRESSION: Acute nondisplaced patellar fracture with moderate knee effusion. Electronically Signed   By: Luke Bun M.D.   On: 12/04/2024 20:25    Review of Systems:   A ROS was performed including pertinent positives and negatives as documented in the HPI.  Physical Exam :   Constitutional: NAD and appears stated age Neurological: Alert and oriented Psych: Appropriate affect and cooperative There were no vitals taken for this visit.   Comprehensive Musculoskeletal Exam:    Patient has left knee in an immobilizer.  Mild effusion noted within the left knee without evidence of visible or palpable deformity.  No notable ecchymosis.  Patient is able to perform a straight leg raise.  Distal sensation intact.  Imaging:   Xray review from 12/04/2024 (left knee 4 views): Nondisplaced transverse patella fracture   I personally reviewed and interpreted the radiographs.      Assessment & Plan Patellar fracture She has an acute, nondisplaced transverse fracture of the left patella. Imaging shows a stable fracture with preserved extensor mechanism, favoring nonoperative management. The anticipated outcome is healing without surgery if precautions are maintained. A hinged knee brace is fitted to maintain extension and she can progress  weightbearing as tolerated although will likely benefit from a walker or cane for stability due to underlying balance issues. A knee immobilizer is advised for sleep if more comfortable, though the brace is preferred for ambulation. Cryotherapy is recommended for 15-20 minutes several times daily to reduce swelling. Physical therapy is recommended at her residence for safe mobility, transfers, and ADLs, with a referral offered if unavailable or delayed, and clinic-based therapy as a contingency. A follow-up is scheduled in two weeks for repeat knee radiographs to assess fracture stability and healing. She is encouraged to contact via MyChart or phone for questions, concerns, or symptom changes.    I personally saw and evaluated the patient, and participated in the management and treatment plan.  Leonce Reveal, PA-C Orthopedics    [1]  Allergies Allergen Reactions   Gold Sodium Thiosulfate Rash    erythematous patches on the eyelids   Ciprofloxacin Other (See Comments)    Other reaction(s): severe diarrhe   Gold     Other reaction(s): Unknown   Other     Gold sodium thiosulfate Palladium chloride   Palladium Chloride     Other reaction(s): allergy test Other reaction(s): Unknown   Silver Nitrate Itching    Other reaction(s): allergy test   "

## 2024-12-18 ENCOUNTER — Ambulatory Visit (HOSPITAL_BASED_OUTPATIENT_CLINIC_OR_DEPARTMENT_OTHER): Admitting: Student

## 2024-12-20 ENCOUNTER — Ambulatory Visit (INDEPENDENT_AMBULATORY_CARE_PROVIDER_SITE_OTHER)

## 2024-12-20 ENCOUNTER — Ambulatory Visit (HOSPITAL_BASED_OUTPATIENT_CLINIC_OR_DEPARTMENT_OTHER): Admitting: Student

## 2024-12-20 DIAGNOSIS — S82035A Nondisplaced transverse fracture of left patella, initial encounter for closed fracture: Secondary | ICD-10-CM | POA: Diagnosis not present

## 2024-12-20 NOTE — Progress Notes (Signed)
 "                                Chief Complaint: Left patella fracture    History of Present Illness  12/20/24: Patient presents today just over 2 weeks status post nondisplaced transverse fracture of the left patella.  She has continued with the hinged knee brace locked in extension and is tolerating this well.  She is currently not having any significant pain within the knee but does have some stiffness.  She takes Tylenol  very intermittently for pain.   12/05/24: Evelyn Hurst is an 89 year old female who presents with left knee pain and reduced mobility following an acute patellar fracture. She tripped on carpet at about 6 PM yesterday and fell with direct impact to the left knee, landing prone. She does not recall all details of the fall. Since the injury she has been unable to bear weight on the left leg due to pain with ambulation and pressure. She uses a wheelchair. She has a walker at home but has difficulty using it due to limited upper body strength and balance, and crutches are not feasible. Pain improves when the knee is fully extended and worsens with certain movements and attempts at active extension. She denies paresthesia in the limb. She has used intermittent ice with decreased swelling and minimal ecchymosis compared to the prior day. She has significant difficulty with transfers, bathing, and toileting and needs assistance for activities of daily living. She has plantar fasciitis in the contralateral foot that worsens balance and made a walking boot unsafe. She lives at [facility]   Surgical History:   None  PMH/PSH/Family History/Social History/Meds/Allergies:    Past Medical History:  Diagnosis Date   Thyroid  disease    Past Surgical History:  Procedure Laterality Date   APPENDECTOMY     CATARACT EXTRACTION BILATERAL W/ ANTERIOR VITRECTOMY     TUBAL LIGATION     Social History   Socioeconomic History   Marital status: Married    Spouse name: Not on file    Number of children: 2   Years of education: Not on file   Highest education level: Not on file  Occupational History   Occupation: semi retired IT TRAINER  Tobacco Use   Smoking status: Former   Smokeless tobacco: Never  Vaping Use   Vaping status: Never Used  Substance and Sexual Activity   Alcohol use: Yes    Alcohol/week: 7.0 standard drinks of alcohol    Types: 7 Standard drinks or equivalent per week    Comment: wine   Drug use: Never   Sexual activity: Not Currently  Other Topics Concern   Not on file  Social History Narrative   Not on file   Social Drivers of Health   Tobacco Use: Medium Risk (12/04/2024)   Patient History    Smoking Tobacco Use: Former    Smokeless Tobacco Use: Never    Passive Exposure: Not on Actuary Strain: Not on file  Food Insecurity: Not on file  Transportation Needs: Not on file  Physical Activity: Not on file  Stress: Not on file  Social Connections: Not on file  Depression (EYV7-0): Not on file  Alcohol Screen: Not on file  Housing: Not on file  Utilities: Not on file  Health Literacy: Not on file   Family History  Problem Relation Age of Onset   Diabetes Mother  Heart disease Father    Allergies[1] Current Outpatient Medications  Medication Sig Dispense Refill   B Complex Vitamins (VITAMIN B COMPLEX 100 IJ) one tablet     Biotin 1000 MCG CHEW SMARTSIG:1 By Mouth     CVS LUTEIN-ZEAXANTHIN PO Take 4 mg by mouth daily at 12 noon.     D 1000 25 MCG (1000 UT) capsule SMARTSIG:1 By Mouth     Dextran 70-Hypromellose, PF, (TEARS NATURALE FREE) 0.1-0.3 % SOLN      diazepam (VALIUM) 5 MG tablet one half tablet as needed     folic acid (FOLVITE) 400 MCG tablet one tablet     Pediatric Multivitamins-Fl (MULTIVITAMINS/FL PO) one tablet     SYNTHROID 50 MCG tablet Take 50 mcg by mouth every Monday, Tuesday, Wednesday, Thursday, and Friday.      SYNTHROID 75 MCG tablet Take 75 mcg by mouth 2 (two) times a week. Saturday and  Sunday     vitamin C (ASCORBIC ACID) 500 MG tablet 1 tablet     No current facility-administered medications for this visit.   No results found.   Review of Systems:   A ROS was performed including pertinent positives and negatives as documented in the HPI.  Physical Exam :   Constitutional: NAD and appears stated age Neurological: Alert and oriented Psych: Appropriate affect and cooperative There were no vitals taken for this visit.   Comprehensive Musculoskeletal Exam:    Left knee appears mildly swollen without significant erythema or warmth.  Patella is nontender with no palpable deformity.  Able to perform a straight leg raise against gravity with good strength.  Distal neurosensory exam intact.  Imaging:   Xray (left knee 3 views): Nondisplaced transverse patella fracture without any changes in alignment compared to prior radiographs   I personally reviewed and interpreted the radiographs.      Assessment & Plan Patellar fracture Patient is 2 weeks status post nondisplaced transverse fracture of the left patella.  Radiographs were repeated today which showed no changes in displacement or alignment and she does have good strength of the extensor mechanism therefore we will continue to benefit from conservative management.  She will remain in extension with a hinged knee brace and can continue with weightbearing as tolerated.  I would like to see her back in 3 weeks for reevaluation and repeat x-rays to assess progression of healing and will plan to discuss addition of physical therapy and gentle range of motion if there are good signs of callus.  All questions answered at this time.    I personally saw and evaluated the patient, and participated in the management and treatment plan.  Leonce Reveal, PA-C Orthopedics    [1]  Allergies Allergen Reactions   Gold Sodium Thiosulfate Rash    erythematous patches on the eyelids   Ciprofloxacin Other (See Comments)     Other reaction(s): severe diarrhe   Gold     Other reaction(s): Unknown   Other     Gold sodium thiosulfate Palladium chloride   Palladium Chloride     Other reaction(s): allergy test Other reaction(s): Unknown   Silver Nitrate Itching    Other reaction(s): allergy test   "

## 2025-01-11 ENCOUNTER — Ambulatory Visit (HOSPITAL_BASED_OUTPATIENT_CLINIC_OR_DEPARTMENT_OTHER): Admitting: Student

## 2025-03-06 ENCOUNTER — Ambulatory Visit: Admitting: Podiatry
